# Patient Record
Sex: Female | Born: 1950 | Race: Black or African American | Hispanic: No | Marital: Single | Smoking: Former smoker
Health system: Southern US, Community
[De-identification: ages and names within clinical notes are randomized; demographics above are authoritative.]

## PROBLEM LIST (undated history)

## (undated) DIAGNOSIS — K219 Gastro-esophageal reflux disease without esophagitis: Secondary | ICD-10-CM

## (undated) DIAGNOSIS — R519 Headache, unspecified: Secondary | ICD-10-CM

## (undated) DIAGNOSIS — I10 Essential (primary) hypertension: Secondary | ICD-10-CM

## (undated) DIAGNOSIS — K760 Fatty (change of) liver, not elsewhere classified: Secondary | ICD-10-CM

## (undated) DIAGNOSIS — R51 Headache: Secondary | ICD-10-CM

## (undated) HISTORY — PX: TUBAL LIGATION: SHX77

## (undated) HISTORY — PX: TONSILLECTOMY: SUR1361

---

## 2015-02-22 DIAGNOSIS — I10 Essential (primary) hypertension: Secondary | ICD-10-CM | POA: Diagnosis not present

## 2015-02-22 DIAGNOSIS — R7309 Other abnormal glucose: Secondary | ICD-10-CM | POA: Diagnosis not present

## 2015-03-11 DIAGNOSIS — I10 Essential (primary) hypertension: Secondary | ICD-10-CM | POA: Diagnosis not present

## 2015-03-11 DIAGNOSIS — E785 Hyperlipidemia, unspecified: Secondary | ICD-10-CM | POA: Diagnosis not present

## 2015-03-11 DIAGNOSIS — B182 Chronic viral hepatitis C: Secondary | ICD-10-CM | POA: Diagnosis not present

## 2015-03-25 ENCOUNTER — Emergency Department (HOSPITAL_COMMUNITY)
Admission: EM | Admit: 2015-03-25 | Discharge: 2015-03-25 | Disposition: A | Discharge status: Home / Self Care | Payer: Medicare HMO | Attending: Emergency Medicine | Admitting: Emergency Medicine

## 2015-03-25 ENCOUNTER — Emergency Department (HOSPITAL_COMMUNITY): Payer: Medicare HMO

## 2015-03-25 ENCOUNTER — Encounter (HOSPITAL_COMMUNITY): Payer: Self-pay | Admitting: Emergency Medicine

## 2015-03-25 DIAGNOSIS — W1839XA Other fall on same level, initial encounter: Secondary | ICD-10-CM | POA: Diagnosis not present

## 2015-03-25 DIAGNOSIS — Y9289 Other specified places as the place of occurrence of the external cause: Secondary | ICD-10-CM | POA: Insufficient documentation

## 2015-03-25 DIAGNOSIS — S42202A Unspecified fracture of upper end of left humerus, initial encounter for closed fracture: Secondary | ICD-10-CM | POA: Diagnosis not present

## 2015-03-25 DIAGNOSIS — M25519 Pain in unspecified shoulder: Secondary | ICD-10-CM | POA: Diagnosis not present

## 2015-03-25 DIAGNOSIS — Y9389 Activity, other specified: Secondary | ICD-10-CM | POA: Diagnosis not present

## 2015-03-25 DIAGNOSIS — Y998 Other external cause status: Secondary | ICD-10-CM | POA: Insufficient documentation

## 2015-03-25 DIAGNOSIS — S4992XA Unspecified injury of left shoulder and upper arm, initial encounter: Secondary | ICD-10-CM | POA: Diagnosis present

## 2015-03-25 DIAGNOSIS — M25512 Pain in left shoulder: Secondary | ICD-10-CM | POA: Diagnosis not present

## 2015-03-25 DIAGNOSIS — S42412A Displaced simple supracondylar fracture without intercondylar fracture of left humerus, initial encounter for closed fracture: Secondary | ICD-10-CM

## 2015-03-25 DIAGNOSIS — T148 Other injury of unspecified body region: Secondary | ICD-10-CM | POA: Diagnosis not present

## 2015-03-25 HISTORY — DX: Fatty (change of) liver, not elsewhere classified: K76.0

## 2015-03-25 HISTORY — DX: Essential (primary) hypertension: I10

## 2015-03-25 MED ORDER — HYDROCODONE-ACETAMINOPHEN 5-325 MG PO TABS
2.0000 | ORAL_TABLET | Freq: Once | ORAL | Status: AC
  Administered 2015-03-25: 2 via ORAL
  Filled 2015-03-25: qty 2

## 2015-03-25 MED ORDER — HYDROCODONE-ACETAMINOPHEN 5-325 MG PO TABS: 1.0000 | ORAL_TABLET | Freq: Four times a day (QID) | ORAL | Status: DC | PRN

## 2015-03-25 NOTE — ED Provider Notes (Signed)
CSN: [id number]     Arrival date & time 03/25/15  [id number] History  By signing my name below, I, Marisue HumbleMichelle Chaffee, attest that this documentation has been prepared under the direction and in the presence of Geoffery Lyonsouglas Shalin Linders, MD . Electronically Signed: Marisue HumbleMichelle Chaffee, Scribe. 03/25/2015. 3:42 AM.   Chief Complaint  Patient presents with  . Fall  . Shoulder Injury   The history is provided by the patient. No language interpreter was used.   HPI Comments:  Joann Vang is a 65 y.o. female who presents to the Emergency Department via EMS s/p fall complaining of shoulder injury. Pt was walking yesterday ~ 1200 when she was charged by a neighborhood dog, causing her to fall backwards. She reports twisting her shoulder when she fell. EMS took her home at that time. Pt complains of left shoulder pain since fall. She woke up this morning to severe pain in left shoulder and mild swelling in left hand. Pain is exacerbated with movement.  No past medical history on file. No past surgical history on file. No family history on file. Social History  Substance Use Topics  . Smoking status: Not on file  . Smokeless tobacco: Not on file  . Alcohol Use: Not on file   OB History    No data available     Review of Systems  Musculoskeletal: Positive for joint swelling and arthralgias.  Neurological: Negative for syncope.  All other systems reviewed and are negative.   Allergies  Review of patient's allergies indicates not on file.  Home Medications   Prior to Admission medications   Not on File   BP 133/85 mmHg  Pulse 84  Temp(Src) 97.9 F (36.6 C) (Oral)  Resp 18  SpO2 100% Physical Exam  Constitutional: She appears well-developed and well-nourished. No distress.  HENT:  Head: Normocephalic and atraumatic.  Mouth/Throat: Oropharynx is clear and moist. No oropharyngeal exudate.  Eyes: Conjunctivae and EOM are normal. Pupils are equal, round, and reactive to light. Right eye exhibits no  discharge. Left eye exhibits no discharge. No scleral icterus.  Neck: Normal range of motion. Neck supple. No JVD present. No thyromegaly present.  Cardiovascular: Normal rate, regular rhythm, normal heart sounds and intact distal pulses.  Exam reveals no gallop and no friction rub.   No murmur heard. Pulmonary/Chest: Effort normal and breath sounds normal. No respiratory distress. She has no wheezes. She has no rales.  Abdominal: Soft. Bowel sounds are normal. She exhibits no distension and no mass. There is no tenderness.  Musculoskeletal: Normal range of motion. She exhibits no edema.  Left shoulder appears grossly normal. TTP over the lateral upper deltoid. Distal ulnar and radial pulses are easily palpable. She can flex, extend, and oppose all fingers. Sensation is intact throughout entire hand.  Lymphadenopathy:    She has no cervical adenopathy.  Neurological: She is alert. Coordination normal.  Skin: Skin is warm and dry. No rash noted. No erythema.  Psychiatric: She has a normal mood and affect. Her behavior is normal.  Nursing note and vitals reviewed.   ED Course  Procedures  DIAGNOSTIC STUDIES:  Oxygen Saturation is 100% on RA, normal by my interpretation.    COORDINATION OF CARE:  3:56 AM Will administer pain medication and order imaging.  Discussed treatment plan with pt at bedside and pt agreed to plan.  Labs Review Labs Reviewed - No data to display  Imaging Review No results found. I have personally reviewed and evaluated these images and lab  results as part of my medical decision-making.    MDM   Final diagnoses:  None    X-rays reveal a proximal humerus fracture which is nondisplaced. She will be placed in a sling and swath and follow-up information will provide provided with Dr. August Saucer.  I personally performed the services described in this documentation, which was scribed in my presence. The recorded information has been reviewed and is accurate.        Geoffery Lyons, MD 03/25/15 [phone]

## 2015-03-25 NOTE — ED Notes (Signed)
Bed: WA09 Expected date:  Expected time:  Means of arrival:  Comments: Shoulder pain s/p fall

## 2015-03-25 NOTE — Discharge Instructions (Signed)
Wear sling and swath as applied until followed up by Dr. August Saucerean. His contact information has been provided in this discharge summary for you to call and arrange this appointment.  Ice for 20 minutes every 2 hours while awake for the next 2-3 days.  Hydrocodone as prescribed as needed for pain.   Humerus Fracture Treated With Immobilization The humerus is the large bone in your upper arm. You have a broken (fractured) humerus. These fractures are easily diagnosed with X-rays. TREATMENT  Simple fractures which will heal without disability are treated with simple immobilization. Immobilization means you will wear a cast, splint, or sling. You have a fracture which will do well with immobilization. The fracture will heal well simply by being held in a good position until it is stable enough to begin range of motion exercises. Do not take part in activities which would further injure your arm.  HOME CARE INSTRUCTIONS   Put ice on the injured area.  Put ice in a plastic bag.  Place a towel between your skin and the bag.  Leave the ice on for 15-20 minutes, 03-04 times a day.  If you have a cast:  Do not scratch the skin under the cast using sharp or pointed objects.  Check the skin around the cast every day. You may put lotion on any red or sore areas.  Keep your cast dry and clean.  If you have a splint:  Wear the splint as directed.  Keep your splint dry and clean.  You may loosen the elastic around the splint if your fingers become numb, tingle, or turn cold or blue.  If you have a sling:  Wear the sling as directed.  Do not put pressure on any part of your cast or splint until it is fully hardened.  Your cast or splint can be protected during bathing with a plastic bag. Do not lower the cast or splint into water.  Only take over-the-counter or prescription medicines for pain, discomfort, or fever as directed by your caregiver.  Do range of motion exercises as instructed by  your caregiver.  Follow up as directed by your caregiver. This is very important in order to avoid permanent injury or disability and chronic pain. SEEK IMMEDIATE MEDICAL CARE IF:   Your skin or nails in the injured arm turn blue or gray.  Your arm feels cold or numb.  You develop severe pain in the injured arm.  You are having problems with the medicines you were given. MAKE SURE YOU:   Understand these instructions.  Will watch your condition.  Will get help right away if you are not doing well or get worse.   This information is not intended to replace advice given to you by your health care provider. Make sure you discuss any questions you have with your health care provider.   Document Released: 03/30/2000 Document Revised: 01/12/2014 Document Reviewed: 05/16/2014 Elsevier Interactive Patient Education [employer]2016 [employer] Inc[employer]

## 2015-03-25 NOTE — ED Notes (Signed)
Brought in by EMS from home with c/o shoulder pain after a fall.  Pt reports that she was walking yesterday at noon when a neighbor's dog "came charging at her, causing her to fall backwards, landing on her left side with left arm underneath".  Pt woke up this morning to severe pain and swelling to left shoulder.

## 2015-03-27 DIAGNOSIS — S42292A Other displaced fracture of upper end of left humerus, initial encounter for closed fracture: Secondary | ICD-10-CM | POA: Diagnosis not present

## 2015-04-04 DIAGNOSIS — S42292D Other displaced fracture of upper end of left humerus, subsequent encounter for fracture with routine healing: Secondary | ICD-10-CM | POA: Diagnosis not present

## 2015-04-05 ENCOUNTER — Other Ambulatory Visit: Payer: Self-pay | Admitting: Orthopedic Surgery

## 2015-04-08 ENCOUNTER — Other Ambulatory Visit (HOSPITAL_COMMUNITY): Payer: Self-pay | Admitting: *Deleted

## 2015-04-08 ENCOUNTER — Other Ambulatory Visit: Payer: Self-pay | Admitting: Orthopedic Surgery

## 2015-04-09 ENCOUNTER — Inpatient Hospital Stay (HOSPITAL_COMMUNITY)
Admission: RE | Admit: 2015-04-09 | Discharge: 2015-04-09 | Disposition: A | Discharge status: Home / Self Care | Payer: Medicare HMO | Source: Ambulatory Visit

## 2015-04-10 ENCOUNTER — Encounter (HOSPITAL_COMMUNITY): Payer: Self-pay | Admitting: *Deleted

## 2015-04-10 DIAGNOSIS — S42292D Other displaced fracture of upper end of left humerus, subsequent encounter for fracture with routine healing: Secondary | ICD-10-CM | POA: Diagnosis not present

## 2015-04-10 NOTE — Progress Notes (Signed)
Pt denies cardiac history, chest pain or sob. 

## 2015-04-11 ENCOUNTER — Inpatient Hospital Stay (HOSPITAL_COMMUNITY): Admission: RE | Admit: 2015-04-11 | Payer: Medicare HMO | Source: Ambulatory Visit | Admitting: Orthopedic Surgery

## 2015-04-11 HISTORY — DX: Headache, unspecified: R51.9

## 2015-04-11 HISTORY — DX: Headache: R51

## 2015-04-11 HISTORY — DX: Gastro-esophageal reflux disease without esophagitis: K21.9

## 2015-04-11 SURGERY — OPEN REDUCTION INTERNAL FIXATION (ORIF) PROXIMAL HUMERUS FRACTURE
Anesthesia: General | Site: Shoulder | Laterality: Left

## 2015-04-24 DIAGNOSIS — S42292D Other displaced fracture of upper end of left humerus, subsequent encounter for fracture with routine healing: Secondary | ICD-10-CM | POA: Diagnosis not present

## 2015-05-02 DIAGNOSIS — B182 Chronic viral hepatitis C: Secondary | ICD-10-CM | POA: Diagnosis not present

## 2015-05-02 DIAGNOSIS — E7800 Pure hypercholesterolemia, unspecified: Secondary | ICD-10-CM | POA: Diagnosis not present

## 2015-05-02 DIAGNOSIS — Z91128 Patient's intentional underdosing of medication regimen for other reason: Secondary | ICD-10-CM | POA: Diagnosis not present

## 2015-05-02 DIAGNOSIS — I10 Essential (primary) hypertension: Secondary | ICD-10-CM | POA: Diagnosis not present

## 2015-05-02 DIAGNOSIS — Z6828 Body mass index (BMI) 28.0-28.9, adult: Secondary | ICD-10-CM | POA: Diagnosis not present

## 2015-05-23 DIAGNOSIS — Z6828 Body mass index (BMI) 28.0-28.9, adult: Secondary | ICD-10-CM | POA: Diagnosis not present

## 2015-05-23 DIAGNOSIS — M12861 Other specific arthropathies, not elsewhere classified, right knee: Secondary | ICD-10-CM | POA: Diagnosis not present

## 2015-05-23 DIAGNOSIS — S42216D Unspecified nondisplaced fracture of surgical neck of unspecified humerus, subsequent encounter for fracture with routine healing: Secondary | ICD-10-CM | POA: Diagnosis not present

## 2015-05-23 DIAGNOSIS — E785 Hyperlipidemia, unspecified: Secondary | ICD-10-CM | POA: Diagnosis not present

## 2015-05-23 DIAGNOSIS — I119 Hypertensive heart disease without heart failure: Secondary | ICD-10-CM | POA: Diagnosis not present

## 2015-05-23 DIAGNOSIS — I10 Essential (primary) hypertension: Secondary | ICD-10-CM | POA: Diagnosis not present

## 2015-05-27 ENCOUNTER — Other Ambulatory Visit: Payer: Self-pay

## 2015-05-27 DIAGNOSIS — Z1231 Encounter for screening mammogram for malignant neoplasm of breast: Secondary | ICD-10-CM

## 2015-06-06 ENCOUNTER — Ambulatory Visit
Admission: RE | Admit: 2015-06-06 | Discharge: 2015-06-06 | Disposition: A | Discharge status: Home / Self Care | Payer: Medicare HMO | Source: Ambulatory Visit

## 2015-06-06 DIAGNOSIS — Z1231 Encounter for screening mammogram for malignant neoplasm of breast: Secondary | ICD-10-CM

## 2015-07-10 ENCOUNTER — Other Ambulatory Visit (HOSPITAL_COMMUNITY): Payer: Self-pay | Admitting: Nurse Practitioner

## 2015-07-10 DIAGNOSIS — B182 Chronic viral hepatitis C: Secondary | ICD-10-CM

## 2015-07-17 DIAGNOSIS — I119 Hypertensive heart disease without heart failure: Secondary | ICD-10-CM | POA: Diagnosis not present

## 2015-07-17 DIAGNOSIS — B182 Chronic viral hepatitis C: Secondary | ICD-10-CM | POA: Diagnosis not present

## 2015-07-17 DIAGNOSIS — E785 Hyperlipidemia, unspecified: Secondary | ICD-10-CM | POA: Diagnosis not present

## 2015-07-18 ENCOUNTER — Ambulatory Visit (HOSPITAL_COMMUNITY): Payer: Medicare HMO

## 2015-07-22 ENCOUNTER — Ambulatory Visit (HOSPITAL_COMMUNITY)
Admission: RE | Admit: 2015-07-22 | Discharge: 2015-07-22 | Disposition: A | Discharge status: Home / Self Care | Payer: Medicare HMO | Source: Ambulatory Visit | Attending: Nurse Practitioner | Admitting: Nurse Practitioner

## 2015-07-22 DIAGNOSIS — B182 Chronic viral hepatitis C: Secondary | ICD-10-CM

## 2015-07-24 ENCOUNTER — Ambulatory Visit (HOSPITAL_COMMUNITY): Payer: Medicare HMO

## 2015-07-26 ENCOUNTER — Ambulatory Visit (HOSPITAL_COMMUNITY): Payer: Medicare HMO

## 2015-08-07 DIAGNOSIS — I119 Hypertensive heart disease without heart failure: Secondary | ICD-10-CM | POA: Diagnosis not present

## 2015-08-07 DIAGNOSIS — I10 Essential (primary) hypertension: Secondary | ICD-10-CM | POA: Diagnosis not present

## 2015-08-07 DIAGNOSIS — B182 Chronic viral hepatitis C: Secondary | ICD-10-CM | POA: Diagnosis not present

## 2015-08-07 DIAGNOSIS — E7800 Pure hypercholesterolemia, unspecified: Secondary | ICD-10-CM | POA: Diagnosis not present

## 2015-08-28 ENCOUNTER — Other Ambulatory Visit (HOSPITAL_COMMUNITY): Payer: Self-pay | Admitting: Nurse Practitioner

## 2015-08-28 ENCOUNTER — Encounter (HOSPITAL_COMMUNITY): Payer: Self-pay

## 2015-08-28 ENCOUNTER — Ambulatory Visit (HOSPITAL_COMMUNITY): Admission: RE | Admit: 2015-08-28 | Payer: Medicare HMO | Source: Ambulatory Visit

## 2015-08-28 ENCOUNTER — Ambulatory Visit (HOSPITAL_COMMUNITY)
Admission: RE | Admit: 2015-08-28 | Discharge: 2015-08-28 | Disposition: A | Discharge status: Home / Self Care | Payer: Medicare HMO | Source: Ambulatory Visit | Attending: Nurse Practitioner | Admitting: Nurse Practitioner

## 2015-08-28 DIAGNOSIS — B182 Chronic viral hepatitis C: Secondary | ICD-10-CM

## 2015-08-28 DIAGNOSIS — K8020 Calculus of gallbladder without cholecystitis without obstruction: Secondary | ICD-10-CM | POA: Insufficient documentation

## 2015-09-17 DIAGNOSIS — B182 Chronic viral hepatitis C: Secondary | ICD-10-CM | POA: Diagnosis not present

## 2015-09-17 DIAGNOSIS — K740 Hepatic fibrosis: Secondary | ICD-10-CM | POA: Diagnosis not present

## 2015-10-08 DIAGNOSIS — I10 Essential (primary) hypertension: Secondary | ICD-10-CM | POA: Diagnosis not present

## 2015-10-08 DIAGNOSIS — B182 Chronic viral hepatitis C: Secondary | ICD-10-CM | POA: Diagnosis not present

## 2015-10-08 DIAGNOSIS — E785 Hyperlipidemia, unspecified: Secondary | ICD-10-CM | POA: Diagnosis not present

## 2015-10-23 DIAGNOSIS — B182 Chronic viral hepatitis C: Secondary | ICD-10-CM | POA: Diagnosis not present

## 2015-11-05 DIAGNOSIS — B182 Chronic viral hepatitis C: Secondary | ICD-10-CM | POA: Diagnosis not present

## 2015-11-05 DIAGNOSIS — K740 Hepatic fibrosis: Secondary | ICD-10-CM | POA: Diagnosis not present

## 2015-11-07 DIAGNOSIS — B182 Chronic viral hepatitis C: Secondary | ICD-10-CM | POA: Diagnosis not present

## 2015-11-07 DIAGNOSIS — E785 Hyperlipidemia, unspecified: Secondary | ICD-10-CM | POA: Diagnosis not present

## 2015-11-07 DIAGNOSIS — I10 Essential (primary) hypertension: Secondary | ICD-10-CM | POA: Diagnosis not present

## 2015-11-20 DIAGNOSIS — B182 Chronic viral hepatitis C: Secondary | ICD-10-CM | POA: Diagnosis not present

## 2016-01-09 DIAGNOSIS — I10 Essential (primary) hypertension: Secondary | ICD-10-CM | POA: Diagnosis not present

## 2016-02-13 DIAGNOSIS — B182 Chronic viral hepatitis C: Secondary | ICD-10-CM | POA: Diagnosis not present

## 2016-02-14 DIAGNOSIS — I10 Essential (primary) hypertension: Secondary | ICD-10-CM | POA: Diagnosis not present

## 2016-02-14 DIAGNOSIS — M12861 Other specific arthropathies, not elsewhere classified, right knee: Secondary | ICD-10-CM | POA: Diagnosis not present

## 2016-02-14 DIAGNOSIS — E785 Hyperlipidemia, unspecified: Secondary | ICD-10-CM | POA: Diagnosis not present

## 2016-02-19 DIAGNOSIS — B182 Chronic viral hepatitis C: Secondary | ICD-10-CM | POA: Diagnosis not present

## 2016-02-19 DIAGNOSIS — K740 Hepatic fibrosis: Secondary | ICD-10-CM | POA: Diagnosis not present

## 2016-05-08 ENCOUNTER — Other Ambulatory Visit: Payer: Self-pay | Admitting: Family Medicine

## 2016-05-08 DIAGNOSIS — Z1231 Encounter for screening mammogram for malignant neoplasm of breast: Secondary | ICD-10-CM

## 2016-05-15 DIAGNOSIS — M12861 Other specific arthropathies, not elsewhere classified, right knee: Secondary | ICD-10-CM | POA: Diagnosis not present

## 2016-05-15 DIAGNOSIS — I10 Essential (primary) hypertension: Secondary | ICD-10-CM | POA: Diagnosis not present

## 2016-05-15 DIAGNOSIS — M12841 Other specific arthropathies, not elsewhere classified, right hand: Secondary | ICD-10-CM | POA: Diagnosis not present

## 2016-05-15 DIAGNOSIS — B182 Chronic viral hepatitis C: Secondary | ICD-10-CM | POA: Diagnosis not present

## 2016-05-15 DIAGNOSIS — I119 Hypertensive heart disease without heart failure: Secondary | ICD-10-CM | POA: Diagnosis not present

## 2016-05-15 DIAGNOSIS — E785 Hyperlipidemia, unspecified: Secondary | ICD-10-CM | POA: Diagnosis not present

## 2016-06-02 DIAGNOSIS — Z1212 Encounter for screening for malignant neoplasm of rectum: Secondary | ICD-10-CM | POA: Diagnosis not present

## 2016-06-02 DIAGNOSIS — Z1211 Encounter for screening for malignant neoplasm of colon: Secondary | ICD-10-CM | POA: Diagnosis not present

## 2016-06-09 ENCOUNTER — Ambulatory Visit
Admission: RE | Admit: 2016-06-09 | Discharge: 2016-06-09 | Disposition: A | Discharge status: Home / Self Care | Payer: Medicare HMO | Source: Ambulatory Visit | Attending: Family Medicine | Admitting: Family Medicine

## 2016-06-09 DIAGNOSIS — Z1231 Encounter for screening mammogram for malignant neoplasm of breast: Secondary | ICD-10-CM | POA: Diagnosis not present

## 2016-08-03 ENCOUNTER — Other Ambulatory Visit (HOSPITAL_COMMUNITY): Payer: Self-pay | Admitting: Nurse Practitioner

## 2016-08-03 DIAGNOSIS — K7400 Hepatic fibrosis, unspecified: Secondary | ICD-10-CM

## 2016-08-11 ENCOUNTER — Ambulatory Visit (HOSPITAL_COMMUNITY)
Admission: RE | Admit: 2016-08-11 | Discharge: 2016-08-11 | Disposition: A | Discharge status: Home / Self Care | Payer: Medicare HMO | Source: Ambulatory Visit | Attending: Nurse Practitioner | Admitting: Nurse Practitioner

## 2016-08-11 DIAGNOSIS — K8020 Calculus of gallbladder without cholecystitis without obstruction: Secondary | ICD-10-CM | POA: Insufficient documentation

## 2016-08-11 DIAGNOSIS — K7400 Hepatic fibrosis, unspecified: Secondary | ICD-10-CM

## 2016-08-11 DIAGNOSIS — K7689 Other specified diseases of liver: Secondary | ICD-10-CM | POA: Diagnosis not present

## 2016-09-22 DIAGNOSIS — K740 Hepatic fibrosis: Secondary | ICD-10-CM | POA: Diagnosis not present

## 2016-09-22 DIAGNOSIS — E785 Hyperlipidemia, unspecified: Secondary | ICD-10-CM | POA: Diagnosis not present

## 2016-09-22 DIAGNOSIS — I10 Essential (primary) hypertension: Secondary | ICD-10-CM | POA: Diagnosis not present

## 2016-09-22 DIAGNOSIS — I119 Hypertensive heart disease without heart failure: Secondary | ICD-10-CM | POA: Diagnosis not present

## 2016-10-22 DIAGNOSIS — I10 Essential (primary) hypertension: Secondary | ICD-10-CM | POA: Diagnosis not present

## 2016-11-12 ENCOUNTER — Other Ambulatory Visit: Payer: Self-pay | Admitting: Family Medicine

## 2016-11-12 DIAGNOSIS — I119 Hypertensive heart disease without heart failure: Secondary | ICD-10-CM

## 2016-11-13 ENCOUNTER — Ambulatory Visit (INDEPENDENT_AMBULATORY_CARE_PROVIDER_SITE_OTHER): Payer: Medicare HMO

## 2016-11-13 DIAGNOSIS — I119 Hypertensive heart disease without heart failure: Secondary | ICD-10-CM | POA: Diagnosis not present

## 2016-11-24 DIAGNOSIS — I10 Essential (primary) hypertension: Secondary | ICD-10-CM | POA: Diagnosis not present

## 2017-02-01 DIAGNOSIS — K7689 Other specified diseases of liver: Secondary | ICD-10-CM | POA: Diagnosis not present

## 2017-02-01 DIAGNOSIS — K740 Hepatic fibrosis: Secondary | ICD-10-CM | POA: Diagnosis not present

## 2017-02-02 ENCOUNTER — Other Ambulatory Visit: Payer: Self-pay | Admitting: Nurse Practitioner

## 2017-02-02 DIAGNOSIS — IMO0001 Reserved for inherently not codable concepts without codable children: Secondary | ICD-10-CM

## 2017-02-02 DIAGNOSIS — K7689 Other specified diseases of liver: Secondary | ICD-10-CM

## 2017-02-12 ENCOUNTER — Ambulatory Visit
Admission: RE | Admit: 2017-02-12 | Discharge: 2017-02-12 | Disposition: A | Discharge status: Home / Self Care | Payer: Medicare HMO | Source: Ambulatory Visit | Attending: Nurse Practitioner | Admitting: Nurse Practitioner

## 2017-02-12 DIAGNOSIS — IMO0001 Reserved for inherently not codable concepts without codable children: Secondary | ICD-10-CM

## 2017-02-12 DIAGNOSIS — K7689 Other specified diseases of liver: Secondary | ICD-10-CM

## 2017-02-12 DIAGNOSIS — K8020 Calculus of gallbladder without cholecystitis without obstruction: Secondary | ICD-10-CM | POA: Diagnosis not present

## 2017-02-12 MED ORDER — GADOXETATE DISODIUM 0.25 MMOL/ML IV SOLN
6.0000 mL | Freq: Once | INTRAVENOUS | Status: AC | PRN
  Administered 2017-02-12: 6 mL via INTRAVENOUS

## 2017-03-09 DIAGNOSIS — K8020 Calculus of gallbladder without cholecystitis without obstruction: Secondary | ICD-10-CM | POA: Diagnosis not present

## 2017-03-09 DIAGNOSIS — K7689 Other specified diseases of liver: Secondary | ICD-10-CM | POA: Diagnosis not present

## 2017-03-25 DIAGNOSIS — I10 Essential (primary) hypertension: Secondary | ICD-10-CM | POA: Diagnosis not present

## 2017-03-25 DIAGNOSIS — E785 Hyperlipidemia, unspecified: Secondary | ICD-10-CM | POA: Diagnosis not present

## 2017-03-25 DIAGNOSIS — M12861 Other specific arthropathies, not elsewhere classified, right knee: Secondary | ICD-10-CM | POA: Diagnosis not present

## 2017-03-25 DIAGNOSIS — Z0000 Encounter for general adult medical examination without abnormal findings: Secondary | ICD-10-CM | POA: Diagnosis not present

## 2017-05-06 ENCOUNTER — Other Ambulatory Visit: Payer: Self-pay | Admitting: Family Medicine

## 2017-05-06 DIAGNOSIS — Z1231 Encounter for screening mammogram for malignant neoplasm of breast: Secondary | ICD-10-CM

## 2017-05-07 DIAGNOSIS — K7689 Other specified diseases of liver: Secondary | ICD-10-CM | POA: Diagnosis not present

## 2017-05-07 DIAGNOSIS — K8020 Calculus of gallbladder without cholecystitis without obstruction: Secondary | ICD-10-CM | POA: Diagnosis not present

## 2017-06-11 ENCOUNTER — Ambulatory Visit
Admission: RE | Admit: 2017-06-11 | Discharge: 2017-06-11 | Disposition: A | Discharge status: Home / Self Care | Payer: Medicare HMO | Source: Ambulatory Visit | Attending: Family Medicine | Admitting: Family Medicine

## 2017-06-11 DIAGNOSIS — Z1231 Encounter for screening mammogram for malignant neoplasm of breast: Secondary | ICD-10-CM

## 2017-07-22 DIAGNOSIS — I119 Hypertensive heart disease without heart failure: Secondary | ICD-10-CM | POA: Diagnosis not present

## 2017-07-22 DIAGNOSIS — E7800 Pure hypercholesterolemia, unspecified: Secondary | ICD-10-CM | POA: Diagnosis not present

## 2017-08-02 DIAGNOSIS — K740 Hepatic fibrosis: Secondary | ICD-10-CM | POA: Diagnosis not present

## 2017-08-02 DIAGNOSIS — K7469 Other cirrhosis of liver: Secondary | ICD-10-CM | POA: Diagnosis not present

## 2017-08-02 DIAGNOSIS — K7689 Other specified diseases of liver: Secondary | ICD-10-CM | POA: Diagnosis not present

## 2017-08-03 ENCOUNTER — Other Ambulatory Visit: Payer: Self-pay | Admitting: Nurse Practitioner

## 2017-08-03 DIAGNOSIS — K7689 Other specified diseases of liver: Secondary | ICD-10-CM

## 2017-08-03 DIAGNOSIS — IMO0001 Reserved for inherently not codable concepts without codable children: Secondary | ICD-10-CM

## 2017-08-03 DIAGNOSIS — K7469 Other cirrhosis of liver: Secondary | ICD-10-CM

## 2017-08-06 DIAGNOSIS — I119 Hypertensive heart disease without heart failure: Secondary | ICD-10-CM | POA: Diagnosis not present

## 2017-08-06 DIAGNOSIS — I10 Essential (primary) hypertension: Secondary | ICD-10-CM | POA: Diagnosis not present

## 2017-08-06 DIAGNOSIS — Z6825 Body mass index (BMI) 25.0-25.9, adult: Secondary | ICD-10-CM | POA: Diagnosis not present

## 2017-08-06 DIAGNOSIS — E785 Hyperlipidemia, unspecified: Secondary | ICD-10-CM | POA: Diagnosis not present

## 2017-08-06 DIAGNOSIS — Z91138 Patient's unintentional underdosing of medication regimen for other reason: Secondary | ICD-10-CM | POA: Diagnosis not present

## 2017-08-19 ENCOUNTER — Ambulatory Visit
Admission: RE | Admit: 2017-08-19 | Discharge: 2017-08-19 | Disposition: A | Discharge status: Home / Self Care | Payer: Medicare HMO | Source: Ambulatory Visit | Attending: Nurse Practitioner | Admitting: Nurse Practitioner

## 2017-08-19 DIAGNOSIS — K7689 Other specified diseases of liver: Secondary | ICD-10-CM

## 2017-08-19 DIAGNOSIS — K7469 Other cirrhosis of liver: Secondary | ICD-10-CM

## 2017-08-19 DIAGNOSIS — IMO0001 Reserved for inherently not codable concepts without codable children: Secondary | ICD-10-CM

## 2017-09-17 ENCOUNTER — Ambulatory Visit
Admission: RE | Admit: 2017-09-17 | Discharge: 2017-09-17 | Disposition: A | Discharge status: Home / Self Care | Payer: Medicare HMO | Source: Ambulatory Visit | Attending: Family Medicine | Admitting: Family Medicine

## 2017-09-17 ENCOUNTER — Other Ambulatory Visit: Payer: Self-pay | Admitting: Family Medicine

## 2017-09-17 DIAGNOSIS — R0602 Shortness of breath: Secondary | ICD-10-CM | POA: Diagnosis not present

## 2017-09-17 DIAGNOSIS — E785 Hyperlipidemia, unspecified: Secondary | ICD-10-CM | POA: Diagnosis not present

## 2017-09-17 DIAGNOSIS — R0689 Other abnormalities of breathing: Secondary | ICD-10-CM

## 2017-09-17 DIAGNOSIS — R0989 Other specified symptoms and signs involving the circulatory and respiratory systems: Principal | ICD-10-CM

## 2017-09-17 DIAGNOSIS — Z6826 Body mass index (BMI) 26.0-26.9, adult: Secondary | ICD-10-CM | POA: Diagnosis not present

## 2017-09-17 DIAGNOSIS — J984 Other disorders of lung: Secondary | ICD-10-CM | POA: Diagnosis not present

## 2017-09-17 DIAGNOSIS — I10 Essential (primary) hypertension: Secondary | ICD-10-CM | POA: Diagnosis not present

## 2018-01-13 DIAGNOSIS — K8020 Calculus of gallbladder without cholecystitis without obstruction: Secondary | ICD-10-CM | POA: Diagnosis not present

## 2018-01-13 DIAGNOSIS — R5383 Other fatigue: Secondary | ICD-10-CM | POA: Diagnosis not present

## 2018-01-13 DIAGNOSIS — I4510 Unspecified right bundle-branch block: Secondary | ICD-10-CM | POA: Diagnosis not present

## 2018-01-13 DIAGNOSIS — Z8619 Personal history of other infectious and parasitic diseases: Secondary | ICD-10-CM | POA: Diagnosis not present

## 2018-01-13 DIAGNOSIS — E559 Vitamin D deficiency, unspecified: Secondary | ICD-10-CM | POA: Diagnosis not present

## 2018-01-13 DIAGNOSIS — E785 Hyperlipidemia, unspecified: Secondary | ICD-10-CM | POA: Diagnosis not present

## 2018-01-13 DIAGNOSIS — I10 Essential (primary) hypertension: Secondary | ICD-10-CM | POA: Diagnosis not present

## 2018-01-13 DIAGNOSIS — R7301 Impaired fasting glucose: Secondary | ICD-10-CM | POA: Diagnosis not present

## 2018-02-07 DIAGNOSIS — K740 Hepatic fibrosis: Secondary | ICD-10-CM | POA: Diagnosis not present

## 2018-02-07 DIAGNOSIS — K7689 Other specified diseases of liver: Secondary | ICD-10-CM | POA: Diagnosis not present

## 2018-02-09 ENCOUNTER — Other Ambulatory Visit: Payer: Self-pay | Admitting: Nurse Practitioner

## 2018-02-09 DIAGNOSIS — K7689 Other specified diseases of liver: Secondary | ICD-10-CM

## 2018-02-23 ENCOUNTER — Ambulatory Visit
Admission: RE | Admit: 2018-02-23 | Discharge: 2018-02-23 | Disposition: A | Discharge status: Home / Self Care | Payer: Medicare HMO | Source: Ambulatory Visit | Attending: Nurse Practitioner | Admitting: Nurse Practitioner

## 2018-02-23 DIAGNOSIS — K8020 Calculus of gallbladder without cholecystitis without obstruction: Secondary | ICD-10-CM | POA: Diagnosis not present

## 2018-02-23 DIAGNOSIS — K7689 Other specified diseases of liver: Secondary | ICD-10-CM

## 2018-02-23 MED ORDER — GADOXETATE DISODIUM 0.25 MMOL/ML IV SOLN
6.0000 mL | Freq: Once | INTRAVENOUS | Status: AC | PRN
  Administered 2018-02-23: 6 mL via INTRAVENOUS

## 2018-03-10 ENCOUNTER — Other Ambulatory Visit: Payer: Self-pay

## 2018-03-10 ENCOUNTER — Encounter (INDEPENDENT_AMBULATORY_CARE_PROVIDER_SITE_OTHER): Payer: Self-pay | Admitting: Primary Care

## 2018-03-10 ENCOUNTER — Ambulatory Visit (INDEPENDENT_AMBULATORY_CARE_PROVIDER_SITE_OTHER): Payer: Medicare HMO | Admitting: Primary Care

## 2018-03-10 VITALS — BP 145/85 | HR 77 | Temp 97.7°F | Ht 63.0 in | Wt 165.6 lb

## 2018-03-10 DIAGNOSIS — I10 Essential (primary) hypertension: Secondary | ICD-10-CM

## 2018-03-10 DIAGNOSIS — K219 Gastro-esophageal reflux disease without esophagitis: Secondary | ICD-10-CM | POA: Diagnosis not present

## 2018-03-10 DIAGNOSIS — Z7689 Persons encountering health services in other specified circumstances: Secondary | ICD-10-CM

## 2018-03-10 MED ORDER — CHLORTHALIDONE 25 MG PO TABS: 25.0000 mg | ORAL_TABLET | Freq: Every day | ORAL | 1 refills | Status: DC

## 2018-03-10 MED ORDER — TELMISARTAN-AMLODIPINE 40-5 MG PO TABS: 0.5000 | ORAL_TABLET | Freq: Every day | ORAL | 1 refills | Status: DC

## 2018-03-10 NOTE — Progress Notes (Signed)
New Patient Office Visit  Subjective:  Patient ID: Joann Vang, female    DOB: Apr 02, 1950  Age: 68 y.o. MRN: [id number]  CC:  Chief Complaint  Patient presents with  . New Patient (Initial Visit)    HTN    HPI Joann Vang presents to establish care.  No PMH except HTN.  Past Medical History:  Diagnosis Date  . Fatty liver   . GERD (gastroesophageal reflux disease)    none recent  . Headache    migraines, once a year  . Hypertension     Past Surgical History:  Procedure Laterality Date  . TONSILLECTOMY    . TUBAL LIGATION      Family History  Problem Relation Age of Onset  . Congestive Heart Failure Mother   . Kidney failure Father   . Breast cancer Sister 99    Social History   Socioeconomic History  . Marital status: Single    Spouse name: Not on file  . Number of children: Not on file  . Years of education: Not on file  . Highest education level: Not on file  Occupational History  . Not on file  Social Needs  . Financial resource strain: Not on file  . Food insecurity:    Worry: Not on file    Inability: Not on file  . Transportation needs:    Medical: Not on file    Non-medical: Not on file  Tobacco Use  . Smoking status: Former Smoker    Last attempt to quit: 04/10/1995    Years since quitting: 22.9  . Smokeless tobacco: Never Used  Substance and Sexual Activity  . Alcohol use: Yes    Comment: none for a month (as of 04/10/15)  . Drug use: No  . Sexual activity: Never  Lifestyle  . Physical activity:    Days per week: Not on file    Minutes per session: Not on file  . Stress: Not on file  Relationships  . Social connections:    Talks on phone: Not on file    Gets together: Not on file    Attends religious service: Not on file    Active member of club or organization: Not on file    Attends meetings of clubs or organizations: Not on file    Relationship status: Not on file  . Intimate partner violence:    Fear of current or ex  partner: Not on file    Emotionally abused: Not on file    Physically abused: Not on file    Forced sexual activity: Not on file  Other Topics Concern  . Not on file  Social History Narrative  . Not on file    ROS Review of Systems  Constitutional: Negative.   HENT: Positive for dental problem.        Filling fell out of tooth appt with dentist  Eyes: Positive for visual disturbance.       Glasses  Respiratory: Negative.   Cardiovascular: Negative.   Gastrointestinal: Negative.   Endocrine: Negative.   Genitourinary: Negative.   Musculoskeletal: Negative.   Skin: Negative.   Allergic/Immunologic: Negative.   Neurological: Negative.   Hematological: Negative.   Psychiatric/Behavioral: Negative.     Objective:   Today's Vitals: BP (!) 145/85 (BP Location: Left Arm, Patient Position: Sitting, Cuff Size: Normal)   Pulse 77   Temp 97.7 F (36.5 C) (Oral)   Ht 5\' 3"  (1.6 m)   Wt 165 lb 9.6 oz (75.1  kg)   SpO2 94%   BMI 29.33 kg/m   Physical Exam Constitutional:      Appearance: Normal appearance.  HENT:     Head: Normocephalic.     Right Ear: Tympanic membrane normal.     Left Ear: Tympanic membrane normal.  Neck:     Musculoskeletal: Normal range of motion and neck supple.  Cardiovascular:     Rate and Rhythm: Normal rate and regular rhythm.     Pulses: Normal pulses.     Heart sounds: Normal heart sounds.  Abdominal:     General: Abdomen is flat. Bowel sounds are normal. There is distension.     Palpations: Abdomen is soft.  Musculoskeletal: Normal range of motion.  Skin:    General: Skin is warm and dry.  Neurological:     General: No focal deficit present.     Mental Status: She is alert and oriented to person, place, and time.  Psychiatric:        Mood and Affect: Mood normal.        Behavior: Behavior normal.     Assessment & Plan:   Problem List Items Addressed This Visit    None    Visit Diagnoses    Encounter to establish care with new  doctor    -  Primary   Essential hypertension       Gastroesophageal reflux disease, esophagitis presence not specified          Outpatient Encounter Medications as of 03/10/2018  Medication Sig  . aspirin 325 MG tablet Take 325-650 mg by mouth every 4 (four) hours as needed (for mild pain.).  Marland Kitchen chlorthalidone (HYGROTON) 25 MG tablet Take 25 mg by mouth daily.  . Telmisartan-Amlodipine 40-5 MG TABS Take 0.5 tablets by mouth daily.  . [DISCONTINUED] HYDROcodone-acetaminophen (NORCO) 5-325 MG tablet Take 1-2 tablets by mouth every 6 (six) hours as needed.   No facility-administered encounter medications on file as of 03/10/2018.    1. Encounter to establish care with new doctor   2. Essential hypertension Elevated at this visit just recently placed on a new Bp meds and readings at home 120/80 < . Refills no changes    3. Gastroesophageal reflux disease, esophagitis presence not specified Takes OTC Rolaids   Follow-up: Return in about 6 months (around 09/10/2018) for HTN.   Grayce Sessions, NP

## 2018-03-10 NOTE — Patient Instructions (Signed)

## 2018-05-03 ENCOUNTER — Other Ambulatory Visit: Payer: Self-pay | Admitting: Primary Care

## 2018-05-03 DIAGNOSIS — Z1231 Encounter for screening mammogram for malignant neoplasm of breast: Secondary | ICD-10-CM

## 2018-07-04 ENCOUNTER — Other Ambulatory Visit: Payer: Self-pay

## 2018-07-04 ENCOUNTER — Ambulatory Visit
Admission: RE | Admit: 2018-07-04 | Discharge: 2018-07-04 | Disposition: A | Discharge status: Home / Self Care | Payer: Medicare HMO | Source: Ambulatory Visit | Attending: Primary Care | Admitting: Primary Care

## 2018-07-04 DIAGNOSIS — Z1231 Encounter for screening mammogram for malignant neoplasm of breast: Secondary | ICD-10-CM | POA: Diagnosis not present

## 2018-07-12 DIAGNOSIS — K7689 Other specified diseases of liver: Secondary | ICD-10-CM | POA: Diagnosis not present

## 2018-09-13 ENCOUNTER — Encounter (INDEPENDENT_AMBULATORY_CARE_PROVIDER_SITE_OTHER): Payer: Self-pay | Admitting: Primary Care

## 2018-09-13 ENCOUNTER — Other Ambulatory Visit: Payer: Self-pay

## 2018-09-13 ENCOUNTER — Ambulatory Visit (INDEPENDENT_AMBULATORY_CARE_PROVIDER_SITE_OTHER): Payer: Medicare HMO | Admitting: Primary Care

## 2018-09-13 DIAGNOSIS — I10 Essential (primary) hypertension: Secondary | ICD-10-CM | POA: Diagnosis not present

## 2018-09-13 DIAGNOSIS — Z2821 Immunization not carried out because of patient refusal: Secondary | ICD-10-CM

## 2018-09-13 DIAGNOSIS — L298 Other pruritus: Secondary | ICD-10-CM | POA: Diagnosis not present

## 2018-09-13 DIAGNOSIS — Z760 Encounter for issue of repeat prescription: Secondary | ICD-10-CM

## 2018-09-13 DIAGNOSIS — L244 Irritant contact dermatitis due to drugs in contact with skin: Secondary | ICD-10-CM

## 2018-09-13 DIAGNOSIS — R21 Rash and other nonspecific skin eruption: Secondary | ICD-10-CM | POA: Diagnosis not present

## 2018-09-13 MED ORDER — CHLORTHALIDONE 25 MG PO TABS: 25.0000 mg | ORAL_TABLET | Freq: Every day | ORAL | 0 refills | Status: DC

## 2018-09-13 MED ORDER — TELMISARTAN-AMLODIPINE 40-5 MG PO TABS: 0.5000 | ORAL_TABLET | Freq: Every day | ORAL | 0 refills | Status: DC

## 2018-09-13 NOTE — Progress Notes (Signed)
Bp 148/88

## 2018-09-13 NOTE — Progress Notes (Addendum)
Established Patient Office Visit  Subjective:  Patient ID: Joann Vang, female    DOB: 07/12/1950  Age: 68 y.o. MRN: [id number]  CC:  Chief Complaint  Patient presents with  . Follow-up    HTN    HPI Joann PushMarie Bartle presents for follow up on hypertension -she denies Denies shortness of breath, headaches, chest pain or lower extremity edema. She also voiced a itchy rash that are on her hands - irritant from cleaning without using protective covering gloves.  Past Medical History:  Diagnosis Date  . Fatty liver   . GERD (gastroesophageal reflux disease)    none recent  . Headache    migraines, once a year  . Hypertension     Past Surgical History:  Procedure Laterality Date  . TONSILLECTOMY    . TUBAL LIGATION      Family History  Problem Relation Age of Onset  . Congestive Heart Failure Mother   . Kidney failure Father   . Breast cancer Sister [id number]    Social History   Socioeconomic History  . Marital status: Single    Spouse name: Not on file  . Number of children: Not on file  . Years of education: Not on file  . Highest education level: Not on file  Occupational History  . Not on file  Social Needs  . Financial resource strain: Not on file  . Food insecurity    Worry: Not on file    Inability: Not on file  . Transportation needs    Medical: Not on file    Non-medical: Not on file  Tobacco Use  . Smoking status: Former Smoker    Quit date: 04/10/1995    Years since quitting: 23.4  . Smokeless tobacco: Never Used  Substance and Sexual Activity  . Alcohol use: Yes    Comment: none for a month (as of 04/10/15)  . Drug use: No  . Sexual activity: Never  Lifestyle  . Physical activity    Days per week: Not on file    Minutes per session: Not on file  . Stress: Not on file  Relationships  . Social Musicianconnections    Talks on phone: Not on file    Gets together: Not on file    Attends religious service: Not on file    Active member of club or  organization: Not on file    Attends meetings of clubs or organizations: Not on file    Relationship status: Not on file  . Intimate partner violence    Fear of current or ex partner: Not on file    Emotionally abused: Not on file    Physically abused: Not on file    Forced sexual activity: Not on file  Other Topics Concern  . Not on file  Social History Narrative  . Not on file    Outpatient Medications Prior to Visit  Medication Sig Dispense Refill  . aspirin 325 MG tablet Take 325-650 mg by mouth every 4 (four) hours as needed (for mild pain.).    Marland Kitchen. chlorthalidone (HYGROTON) 25 MG tablet Take 1 tablet (25 mg total) by mouth daily. 90 tablet 1  . Telmisartan-amLODIPine 40-5 MG TABS Take 0.5 tablets by mouth daily. 90 tablet 1   No facility-administered medications prior to visit.     No Known Allergies  ROS Review of Systems  Skin: Negative for rash.  All other systems reviewed and are negative.     Objective:    Physical Exam  Constitutional: She is oriented to person, place, and time. She appears well-developed and well-nourished.  HENT:  Head: Normocephalic.  Eyes: EOM are normal.  Neck: Normal range of motion. Neck supple.  Cardiovascular: Normal rate and regular rhythm.  Pulmonary/Chest: Effort normal and breath sounds normal.  Abdominal: Soft. Bowel sounds are normal. She exhibits distension.  Musculoskeletal: Normal range of motion.  Neurological: She is oriented to person, place, and time.  Skin: Skin is warm. Rash noted. There is erythema.  Psychiatric: She has a normal mood and affect.    There were no vitals taken for this visit. Wt Readings from Last 3 Encounters:  03/10/18 165 lb 9.6 oz (75.1 kg)     Health Maintenance Due  Topic Date Due  . COLONOSCOPY  11/02/1999  . DEXA SCAN  11/02/2014  . INFLUENZA VACCINE  08/06/2018    There are no preventive care reminders to display for this patient.    Assessment & Plan:  [name] was seen today  for follow-up.  Diagnoses and all orders for this visit:  Influenza vaccination declined  Irritant contact dermatitis due to drug in contact with skin Contact dermatitis is hypersensitivity reaction to a substance causing cellular immunity response. This may cause papules, vesicles, bullae with surrounding erythema and pruritic .Conservative treatment luke warm baths, Aveeno oatmeal bath or emollients. Medications can be discussed at this visit- antihistamine or pruritic medications. Causes may be contributed to changes in soaps, deodorants, laundry detergent or environmental- grass, weeds and trees.  Essential hypertension Counseled on blood pressure goal of less than 130/80, low-sodium, DASH diet, medication compliance, 150 minutes of moderate intensity exercise per week. Discussed medication compliance, adverse effects. Other orders/Medication Refilled -     Telmisartan-amLODIPine 40-5 MG TABS; Take 0.5 tablets by mouth daily. -     chlorthalidone (HYGROTON) 25 MG tablet; Take 1 tablet (25 mg total) by mouth daily.     Meds ordered this encounter  Medications  . Telmisartan-amLODIPine 40-5 MG TABS    Sig: Take 0.5 tablets by mouth daily.    Dispense:  90 tablet    Refill:  0  . chlorthalidone (HYGROTON) 25 MG tablet    Sig: Take 1 tablet (25 mg total) by mouth daily.    Dispense:  90 tablet    Refill:  0    Follow-up: No follow-ups on file.    Kerin Perna, NP

## 2018-12-10 ENCOUNTER — Other Ambulatory Visit (INDEPENDENT_AMBULATORY_CARE_PROVIDER_SITE_OTHER): Payer: Self-pay | Admitting: Primary Care

## 2019-02-13 DIAGNOSIS — K7689 Other specified diseases of liver: Secondary | ICD-10-CM | POA: Diagnosis not present

## 2019-02-13 DIAGNOSIS — K7402 Hepatic fibrosis, advanced fibrosis: Secondary | ICD-10-CM | POA: Diagnosis not present

## 2019-02-15 ENCOUNTER — Other Ambulatory Visit: Payer: Self-pay | Admitting: Nurse Practitioner

## 2019-02-15 DIAGNOSIS — K7689 Other specified diseases of liver: Secondary | ICD-10-CM

## 2019-02-27 DIAGNOSIS — K7402 Hepatic fibrosis, advanced fibrosis: Secondary | ICD-10-CM | POA: Diagnosis not present

## 2019-03-06 ENCOUNTER — Other Ambulatory Visit: Payer: Medicare HMO

## 2019-03-07 DIAGNOSIS — H0012 Chalazion right lower eyelid: Secondary | ICD-10-CM | POA: Diagnosis not present

## 2019-03-10 ENCOUNTER — Other Ambulatory Visit: Payer: Self-pay

## 2019-03-10 ENCOUNTER — Ambulatory Visit
Admission: RE | Admit: 2019-03-10 | Discharge: 2019-03-10 | Disposition: A | Discharge status: Home / Self Care | Payer: Medicare HMO | Source: Ambulatory Visit | Attending: Nurse Practitioner | Admitting: Nurse Practitioner

## 2019-03-10 DIAGNOSIS — K7689 Other specified diseases of liver: Secondary | ICD-10-CM | POA: Diagnosis not present

## 2019-03-10 MED ORDER — GADOBENATE DIMEGLUMINE 529 MG/ML IV SOLN
15.0000 mL | Freq: Once | INTRAVENOUS | Status: AC | PRN
  Administered 2019-03-10: 15 mL via INTRAVENOUS

## 2019-03-20 ENCOUNTER — Other Ambulatory Visit (INDEPENDENT_AMBULATORY_CARE_PROVIDER_SITE_OTHER): Payer: Self-pay | Admitting: Primary Care

## 2019-03-20 DIAGNOSIS — I10 Essential (primary) hypertension: Secondary | ICD-10-CM

## 2019-03-20 MED ORDER — CHLORTHALIDONE 25 MG PO TABS: 25.0000 mg | ORAL_TABLET | Freq: Every day | ORAL | 0 refills | Status: DC

## 2019-03-20 MED ORDER — TELMISARTAN-AMLODIPINE 40-5 MG PO TABS: 0.5000 | ORAL_TABLET | Freq: Every day | ORAL | 0 refills | Status: DC

## 2019-03-29 DIAGNOSIS — H0012 Chalazion right lower eyelid: Secondary | ICD-10-CM | POA: Diagnosis not present

## 2019-04-13 ENCOUNTER — Other Ambulatory Visit: Payer: Self-pay

## 2019-04-13 ENCOUNTER — Ambulatory Visit (INDEPENDENT_AMBULATORY_CARE_PROVIDER_SITE_OTHER): Payer: Medicare HMO | Admitting: Primary Care

## 2019-04-13 ENCOUNTER — Other Ambulatory Visit (INDEPENDENT_AMBULATORY_CARE_PROVIDER_SITE_OTHER): Payer: Self-pay | Admitting: Primary Care

## 2019-04-13 ENCOUNTER — Encounter (INDEPENDENT_AMBULATORY_CARE_PROVIDER_SITE_OTHER): Payer: Self-pay | Admitting: Primary Care

## 2019-04-13 VITALS — BP 148/95 | HR 74 | Temp 96.3°F | Ht 63.0 in | Wt 175.0 lb

## 2019-04-13 DIAGNOSIS — I10 Essential (primary) hypertension: Secondary | ICD-10-CM | POA: Diagnosis not present

## 2019-04-13 DIAGNOSIS — K7469 Other cirrhosis of liver: Secondary | ICD-10-CM

## 2019-04-13 DIAGNOSIS — Z760 Encounter for issue of repeat prescription: Secondary | ICD-10-CM

## 2019-04-13 MED ORDER — CHLORTHALIDONE 25 MG PO TABS: 25.0000 mg | ORAL_TABLET | Freq: Every day | ORAL | 1 refills | Status: DC

## 2019-04-13 MED ORDER — TELMISARTAN-AMLODIPINE 40-5 MG PO TABS: 0.5000 | ORAL_TABLET | Freq: Every day | ORAL | 1 refills | Status: DC

## 2019-04-13 NOTE — Progress Notes (Signed)
lip

## 2019-04-13 NOTE — Patient Instructions (Signed)

## 2019-04-13 NOTE — Progress Notes (Signed)
[name]  Is a 69 year old African American female with a history of hypertension. She is able to monitor her blood pressure at home. Only reason why she is in today is for a refill on blood pressure medication .    Patient reports adherence with medications.  Current Medication List Current Outpatient Medications on File Prior to Visit  Medication Sig Dispense Refill  . chlorthalidone (HYGROTON) 25 MG tablet Take 1 tablet (25 mg total) by mouth daily. 30 tablet 0  . Telmisartan-amLODIPine 40-5 MG TABS Take 0.5 tablets by mouth daily. 30 tablet 0   No current facility-administered medications on file prior to visit.   Past Medical History  Past Medical History:  Diagnosis Date  . Fatty liver   . GERD (gastroesophageal reflux disease)    none recent  . Headache    migraines, once a year  . Hypertension    Dietary habits include: low sodium diet/hearthy heart Exercise habits include:3 times a week  Family / Social history:   ASCVD risk factors include- [place] CHA2DS2-VASc Score =  3 The patient's score is based upon:        ASSESSMENT AND PLAN: Dynver was seen today for hypertension.  Diagnoses and all orders for this visit:  Essential hypertension Counseled on blood pressure goal of less than 130/80, low-sodium, DASH diet, medication compliance, 150 minutes of moderate intensity exercise per week. Readings at home systolic ranges 761-607 and diastolic 37-10 Discussed medication compliance, adverse effects.  Medication refill Chlorthalidone 25 mg daily  Telmisartan-amlodipine 40/5mg  daily    Signed,  Kerin Perna, NP    04/13/2019 9:41 AM    O:  Physical Exam Vitals reviewed.  Constitutional:      Appearance: She is obese.  HENT:     Head: Normocephalic.     Nose: Nose normal.  Eyes:     Extraocular Movements: Extraocular movements intact.     Pupils: Pupils are equal, round, and reactive to light.  Cardiovascular:     Rate and Rhythm: Normal  rate and regular rhythm.  Pulmonary:     Effort: Pulmonary effort is normal.     Breath sounds: Normal breath sounds.  Abdominal:     General: Bowel sounds are normal.  Musculoskeletal:        General: Normal range of motion.     Cervical back: Normal range of motion and neck supple.  Skin:    General: Skin is warm and dry.  Neurological:     Mental Status: She is alert and oriented to person, place, and time.  Psychiatric:        Mood and Affect: Mood normal.        Behavior: Behavior normal.        Thought Content: Thought content normal.      Review of Systems  All other systems reviewed and are negative.   Last 3 Office BP readings: BP Readings from Last 3 Encounters:  04/13/19 (!) 148/95  03/10/18 (!) 145/85  03/25/15 130/78    BMET No results found for: NA, K, CL, CO2, GLUCOSE, BUN, CREATININE, CALCIUM, GFRNONAA, GFRAA  Renal function: CrCl cannot be calculated (No successful lab value found.).  Clinical ASCVD: Yes  The ASCVD Risk score Mikey Bussing DC Jr., et al., 2013) failed to calculate for the following reasons:   Cannot find a previous HDL lab   Cannot find a previous total cholesterol lab   A/P: Essential hypertension Hypertension longstanding diagnosed currently  on current  medications. BP Goal = 130/80 mmHg. Patient is adherent with current medications.  -Continued on current regiment  -F/u labs ordered - future CMP, lipids, CBC  -Counseled on lifestyle modifications for blood pressure control including reduced dietary sodium, increased exercise, adequate sleep [name] was seen today for hypertension.   Medication refill -     chlorthalidone (HYGROTON) 25 MG tablet; Take 1 tablet (25 mg total) by mouth daily. -     Telmisartan-amLODIPine 40-5 MG TABS; Take 0.5 tablets by mouth daily.

## 2019-05-29 ENCOUNTER — Other Ambulatory Visit: Payer: Self-pay | Admitting: Primary Care

## 2019-05-29 DIAGNOSIS — Z1231 Encounter for screening mammogram for malignant neoplasm of breast: Secondary | ICD-10-CM

## 2019-07-05 ENCOUNTER — Ambulatory Visit
Admission: RE | Admit: 2019-07-05 | Discharge: 2019-07-05 | Disposition: A | Discharge status: Home / Self Care | Payer: Medicare HMO | Source: Ambulatory Visit | Attending: Primary Care | Admitting: Primary Care

## 2019-07-05 ENCOUNTER — Other Ambulatory Visit: Payer: Self-pay

## 2019-07-05 DIAGNOSIS — Z1231 Encounter for screening mammogram for malignant neoplasm of breast: Secondary | ICD-10-CM | POA: Diagnosis not present

## 2019-07-17 DIAGNOSIS — H524 Presbyopia: Secondary | ICD-10-CM | POA: Diagnosis not present

## 2019-07-18 ENCOUNTER — Other Ambulatory Visit: Payer: Self-pay

## 2019-07-18 ENCOUNTER — Encounter (INDEPENDENT_AMBULATORY_CARE_PROVIDER_SITE_OTHER): Payer: Self-pay | Admitting: Primary Care

## 2019-07-18 ENCOUNTER — Ambulatory Visit (INDEPENDENT_AMBULATORY_CARE_PROVIDER_SITE_OTHER): Payer: Medicare HMO | Admitting: Primary Care

## 2019-07-18 VITALS — BP 159/87 | HR 61 | Temp 98.0°F | Ht 63.0 in | Wt 174.8 lb

## 2019-07-18 DIAGNOSIS — I10 Essential (primary) hypertension: Secondary | ICD-10-CM

## 2019-07-18 DIAGNOSIS — Z1211 Encounter for screening for malignant neoplasm of colon: Secondary | ICD-10-CM

## 2019-07-18 NOTE — Patient Instructions (Signed)
We have discussed target BP range and blood pressure goal. I have advised patient to check BP regularly and to call us back or report to clinic if the numbers are consistently higher than 140/90. We discussed the importance of compliance with medical therapy and DASH diet recommended, consequences of uncontrolled hypertension discussed.  - continue current BP medications Check blood pressure daily at the same time. Keep a log of all blood pressure readings.  DASH DIET; No salt or low sodium diet  If pressure if greater than 150/100 notify me here at the office.  If it's persistently greater 180/90, go to the Emergency Department.  Take all medication as prescribed.   Avoid smoked meats which are high in sodium content.  Avoid soda which contains sodium and are high in sugar which increases your risk for diabetes.

## 2019-07-18 NOTE — Progress Notes (Signed)
Established Patient Office Visit  Subjective:  Patient ID: Joann Vang, female    DOB: 1950-06-16  Age: 69 y.o. MRN: [id number]  CC:  Chief Complaint  Patient presents with  . fecal occult    HPI [name] is a 69 year old female presents today for a colon guard test her insurance sent her information she needed it done. Blood pressure is elevated. She denies shortness of breath, headaches, chest pain or lower extremity edema. Pharmacy has change medication will called now taking 3 pills instead of 2. Called [facility] closed left a message.    Past Medical History:  Diagnosis Date  . Fatty liver   . GERD (gastroesophageal reflux disease)    none recent  . Headache    migraines, once a year  . Hypertension     Past Surgical History:  Procedure Laterality Date  . TONSILLECTOMY    . TUBAL LIGATION      Family History  Problem Relation Age of Onset  . Congestive Heart Failure Mother   . Kidney failure Father   . Breast cancer Sister 39    Social History   Socioeconomic History  . Marital status: Single    Spouse name: Not on file  . Number of children: Not on file  . Years of education: Not on file  . Highest education level: Not on file  Occupational History  . Not on file  Tobacco Use  . Smoking status: Former Smoker    Quit date: 04/10/1995    Years since quitting: 24.2  . Smokeless tobacco: Never Used  Substance and Sexual Activity  . Alcohol use: Yes    Comment: none for a month (as of 04/10/15)  . Drug use: No  . Sexual activity: Never  Other Topics Concern  . Not on file  Social History Narrative  . Not on file   Social Determinants of Health   Financial Resource Strain:   . Difficulty of Paying Living Expenses:   Food Insecurity:   . Worried About Charity fundraiser in the Last Year:   . Arboriculturist in the Last Year:   Transportation Needs:   . Film/video editor (Medical):   Marland Kitchen Lack of Transportation (Non-Medical):    Physical Activity:   . Days of Exercise per Week:   . Minutes of Exercise per Session:   Stress:   . Feeling of Stress :   Social Connections:   . Frequency of Communication with Friends and Family:   . Frequency of Social Gatherings with Friends and Family:   . Attends Religious Services:   . Active Member of Clubs or Organizations:   . Attends Archivist Meetings:   Marland Kitchen Marital Status:   Intimate Partner Violence:   . Fear of Current or Ex-Partner:   . Emotionally Abused:   Marland Kitchen Physically Abused:   . Sexually Abused:     Outpatient Medications Prior to Visit  Medication Sig Dispense Refill  . chlorthalidone (HYGROTON) 25 MG tablet Take 1 tablet (25 mg total) by mouth daily. 90 tablet 1  . Telmisartan-amLODIPine 40-5 MG TABS Take 0.5 tablets by mouth daily. 90 tablet 1  . amLODipine (NORVASC) 2.5 MG tablet Take 2.5 mg by mouth daily.    Marland Kitchen telmisartan (MICARDIS) 20 MG tablet Take 20 mg by mouth daily.     No facility-administered medications prior to visit.    No Known Allergies  ROS Review of Systems  All other  systems reviewed and are negative.     Objective:    Physical Exam Vitals:   07/18/19 1619  BP: (!) 159/87  Pulse: 61  Temp: 98 F (36.7 C)  TempSrc: Oral  SpO2: 93%  Weight: 174 lb 12.8 oz (79.3 kg)  Height: '5\' 3"'  (1.6 m)   General: Vital signs reviewed.  Patient is well-developed and well-nourished, in no acute distress and cooperative with exam.  Head: Normocephalic and atraumatic. Eyes: EOMI, conjunctivae normal, no scleral icterus. (no red reflex right eye) Neck: Supple, trachea midline, normal ROM, no JVD, masses, thyromegaly, or carotid bruit present.  Cardiovascular: RRR, S1 normal, S2 normal, no murmurs, gallops, or rubs. Pulmonary/Chest: Clear to auscultation bilaterally, no wheezes, rales, or rhonchi. Abdominal: Soft, non-tender, non-distended, BS +, no masses, organomegaly, or guarding present.  Musculoskeletal: No joint  deformities, erythema, or stiffness, ROM full and nontender. Extremities: No lower extremity edema bilaterally,  pulses symmetric and intact bilaterally. No cyanosis or clubbing. Neurological: A&O x3, Strength is normal and symmetric bilaterally, cranial nerve II-XII are grossly intact, no focal motor deficit, sensory intact to light touch bilaterally.  Skin: Warm, dry and intact. No rashes or erythema. Psychiatric: Normal mood and affect. speech and behavior is normal. Cognition and memory are normal.    Health Maintenance Due  Topic Date Due  . COVID-19 Vaccine (1) Never done  . COLONOSCOPY  Never done    There are no preventive care reminders to display for this patient.  No results found for: TSH No results found for: WBC, HGB, HCT, MCV, PLT No results found for: NA, K, CHLORIDE, CO2, GLUCOSE, BUN, CREATININE, BILITOT, ALKPHOS, AST, ALT, PROT, ALBUMIN, CALCIUM, ANIONGAP, EGFR, GFR No results found for: CHOL No results found for: HDL No results found for: LDLCALC No results found for: TRIG No results found for: CHOLHDL No results found for: HGBA1C    Assessment & Plan:  Rosario was seen today for fecal occult.  Diagnoses and all orders for this visit:  Special screening for malignant neoplasms, colon -     Fecal occult blood, imunochemical([facility]); Future  Essential hypertension Counseled on blood pressure goal of less than 130/80, elevated today. She is able to check bp at home continue low-sodium, DASH diet, medication compliance, 150 minutes of moderate intensity exercise per week. She prefers to take medication at bed time   No orders of the defined types were placed in this encounter.   Follow-up: Return in about 3 weeks (around 08/08/2019) for tele BP ck.    Kerin Perna, NP

## 2019-07-19 ENCOUNTER — Other Ambulatory Visit (INDEPENDENT_AMBULATORY_CARE_PROVIDER_SITE_OTHER): Payer: Self-pay | Admitting: Primary Care

## 2019-07-19 ENCOUNTER — Telehealth (INDEPENDENT_AMBULATORY_CARE_PROVIDER_SITE_OTHER): Payer: Self-pay | Admitting: Primary Care

## 2019-07-19 ENCOUNTER — Other Ambulatory Visit (INDEPENDENT_AMBULATORY_CARE_PROVIDER_SITE_OTHER): Payer: Self-pay

## 2019-07-19 DIAGNOSIS — Z1211 Encounter for screening for malignant neoplasm of colon: Secondary | ICD-10-CM | POA: Diagnosis not present

## 2019-07-19 DIAGNOSIS — I10 Essential (primary) hypertension: Secondary | ICD-10-CM

## 2019-07-19 MED ORDER — TELMISARTAN-AMLODIPINE 40-5 MG PO TABS: 0.5000 | ORAL_TABLET | Freq: Every day | ORAL | 1 refills | Status: DC

## 2019-07-19 NOTE — Telephone Encounter (Signed)
Sent to PCP ?

## 2019-07-19 NOTE — Progress Notes (Signed)
Received called from [facility] to clarify mediation she was sent Telmisartan 20mg  and amlodipine 2.5 90 day supply . Reading yesterday concerned me. Intended to increase dose but today she confessed she had eaten something and today her reading is 120/78. No medication changes . Will do a Bp tele in 2-3 weeks.

## 2019-07-19 NOTE — Telephone Encounter (Signed)
Spoke with pharmacist at 9:00 am and this was resolved. Called [facility] spoke with pharmacist he assured me no one from there placed a called regarding her medication.

## 2019-07-19 NOTE — Telephone Encounter (Signed)
Called to get clarification on a medication dosage for Telmisartan-amLODIPine 40-5 MG TABS. Please call to discuss at [phone]

## 2019-07-20 LAB — FECAL OCCULT BLOOD, IMMUNOCHEMICAL: Fecal Occult Bld: NEGATIVE

## 2019-07-21 ENCOUNTER — Telehealth (INDEPENDENT_AMBULATORY_CARE_PROVIDER_SITE_OTHER): Payer: Self-pay

## 2019-07-21 NOTE — Telephone Encounter (Signed)
Patient is aware of negative colon cancer screening, repeat in 1 year. She verbalized understanding. Maryjean Morn, CMA

## 2019-07-21 NOTE — Telephone Encounter (Signed)
-----   Message from Grayce Sessions, NP sent at 07/21/2019  1:22 PM EDT ----- FOB is negative will repeat in 1 year

## 2019-08-09 ENCOUNTER — Telehealth (INDEPENDENT_AMBULATORY_CARE_PROVIDER_SITE_OTHER): Payer: Medicare HMO | Admitting: Primary Care

## 2019-08-09 ENCOUNTER — Other Ambulatory Visit: Payer: Self-pay

## 2019-08-09 ENCOUNTER — Encounter (INDEPENDENT_AMBULATORY_CARE_PROVIDER_SITE_OTHER): Payer: Self-pay | Admitting: Primary Care

## 2019-08-09 DIAGNOSIS — I10 Essential (primary) hypertension: Secondary | ICD-10-CM

## 2019-08-09 DIAGNOSIS — Z760 Encounter for issue of repeat prescription: Secondary | ICD-10-CM | POA: Diagnosis not present

## 2019-08-09 MED ORDER — CHLORTHALIDONE 25 MG PO TABS: 25.0000 mg | ORAL_TABLET | Freq: Every day | ORAL | 1 refills | Status: DC

## 2019-08-09 MED ORDER — TELMISARTAN-AMLODIPINE 40-5 MG PO TABS: 0.5000 | ORAL_TABLET | Freq: Every day | ORAL | 1 refills | Status: DC

## 2019-08-09 NOTE — Progress Notes (Addendum)
                    [facility]  I connected with Ms. Rayleen Wyrick  by telephone  and verified that I am speaking with the correct person using two identifiers. Patient is at home  I discussed the limitations, risks, security and privacy concerns of performing an evaluation and management service by telephone and the availability of in person appointments. I also discussed with the patient that there may be a patient responsible charge related to this service. The patient expressed understanding and agreed to proceed.  Gwinda Passe Np-C at office  Ms. Mauri Temkin is a 69 year old female having a blood pressure follow-up.  She is currently on chlorthalidone 25 mg and telmisartan/amlodipine 40/5  She is  taking daily Patient reports adherence with medications.  Current Medication List telmisartan/amlodipine 40/5 daily chlorthalidone 25 mg daily  Past Medical History  Past Medical History:  Diagnosis Date  . Fatty liver   . GERD (gastroesophageal reflux disease)    none recent  . Headache    migraines, once a year  . Hypertension    Dietary habits include: healthy diet  Exercise habits include:very little  Family / Social history: unknown  ASCVD risk factors include-   Review of Systems  All other systems reviewed and are negative.   Last 3 Office BP readings: BP Readings from Last 3 Encounters:  07/18/19 (!) 159/87  04/13/19 (!) 148/95  03/10/18 (!) 145/85    BMET No results found for: NA, K, CL, CO2, GLUCOSE, BUN, CREATININE, CALCIUM, GFRNONAA, GFRAA  Renal function: CrCl cannot be calculated (No successful lab value found.).  Clinical ASCVD: Yes  The ASCVD Risk score Denman George DC Jr., et al., 2013) failed to calculate for the following reasons:   Cannot find a previous HDL lab   Cannot find a previous total cholesterol lab   Glendy was seen today for blood pressure check.  Diagnoses and all orders for this visit:  Medication  refill Telmisartan-amLODIPine 40-5 MG TABS; Take 0.5 tablets by mouth daily. -     chlorthalidone (HYGROTON) 25 MG tablet; Take 1 tablet (25 mg total) by mouth daily.   Essential hypertension Hypertension longstanding currently Telmaisatan-amlodipine 40/5  on current medications. BP Goal = 130/80  mmHg. Patient is adherent with current medications.  -F/u labs ordered -  -Counseled on lifestyle modifications for blood pressure control including reduced dietary sodium, increased exercise, adequate sleep Time spent 12 mins Grayce Sessions

## 2019-10-06 ENCOUNTER — Other Ambulatory Visit: Payer: Self-pay

## 2019-10-06 ENCOUNTER — Other Ambulatory Visit (INDEPENDENT_AMBULATORY_CARE_PROVIDER_SITE_OTHER): Payer: Medicare HMO

## 2019-10-06 DIAGNOSIS — I10 Essential (primary) hypertension: Secondary | ICD-10-CM

## 2019-10-07 LAB — LIPID PANEL
Chol/HDL Ratio: 4.2 ratio (ref 0.0–4.4)
Cholesterol, Total: 190 mg/dL (ref 100–199)
HDL: 45 mg/dL (ref >39)
LDL Chol Calc (NIH): 127 mg/dL — ABNORMAL HIGH (ref 0–99)
Triglycerides: 99 mg/dL (ref 0–149)
VLDL Cholesterol Cal: 18 mg/dL (ref 5–40)

## 2019-10-07 LAB — CMP14+EGFR
ALT: 15 IU/L (ref 0–32)
AST: 19 IU/L (ref 0–40)
Albumin/Globulin Ratio: 1.3 (ref 1.2–2.2)
Albumin: 4.4 g/dL (ref 3.8–4.8)
Alkaline Phosphatase: 81 IU/L (ref 44–121)
BUN/Creatinine Ratio: 16 (ref 12–28)
BUN: 13 mg/dL (ref 8–27)
Bilirubin Total: 0.4 mg/dL (ref 0.0–1.2)
CO2: 25 mmol/L (ref 20–29)
Calcium: 9.6 mg/dL (ref 8.7–10.3)
Chloride: 96 mmol/L (ref 96–106)
Creatinine, Ser: 0.83 mg/dL (ref 0.57–1.00)
GFR calc Af Amer: 83 mL/min/1.73 (ref >59)
GFR calc non Af Amer: 72 mL/min/1.73 (ref >59)
Globulin, Total: 3.5 g/dL (ref 1.5–4.5)
Glucose: 94 mg/dL (ref 65–99)
Potassium: 3.3 mmol/L — ABNORMAL LOW (ref 3.5–5.2)
Sodium: 135 mmol/L (ref 134–144)
Total Protein: 7.9 g/dL (ref 6.0–8.5)

## 2019-10-07 LAB — CBC WITH DIFFERENTIAL/PLATELET
Basophils Absolute: 0 10*3/uL (ref 0.0–0.2)
Basos: 0 %
EOS (ABSOLUTE): 0 10*3/uL (ref 0.0–0.4)
Eos: 1 %
Hematocrit: 38.8 % (ref 34.0–46.6)
Hemoglobin: 13.4 g/dL (ref 11.1–15.9)
Immature Grans (Abs): 0 10*3/uL (ref 0.0–0.1)
Immature Granulocytes: 0 %
Lymphocytes Absolute: 3.1 10*3/uL (ref 0.7–3.1)
Lymphs: 50 %
MCH: 29.6 pg (ref 26.6–33.0)
MCHC: 34.5 g/dL (ref 31.5–35.7)
MCV: 86 fL (ref 79–97)
Monocytes Absolute: 0.6 10*3/uL (ref 0.1–0.9)
Monocytes: 11 %
Neutrophils Absolute: 2.3 10*3/uL (ref 1.4–7.0)
Neutrophils: 38 %
Platelets: 226 10*3/uL (ref 150–450)
RBC: 4.53 x10E6/uL (ref 3.77–5.28)
RDW: 13.1 % (ref 11.7–15.4)
WBC: 6.1 10*3/uL (ref 3.4–10.8)

## 2019-10-09 ENCOUNTER — Other Ambulatory Visit (INDEPENDENT_AMBULATORY_CARE_PROVIDER_SITE_OTHER): Payer: Self-pay | Admitting: Primary Care

## 2019-10-09 DIAGNOSIS — E7800 Pure hypercholesterolemia, unspecified: Secondary | ICD-10-CM

## 2019-10-09 DIAGNOSIS — E876 Hypokalemia: Secondary | ICD-10-CM

## 2019-10-09 MED ORDER — POTASSIUM CHLORIDE CRYS ER 10 MEQ PO TBCR: 20.0000 meq | EXTENDED_RELEASE_TABLET | Freq: Every day | ORAL | 0 refills | Status: DC

## 2019-10-09 MED ORDER — ATORVASTATIN CALCIUM 10 MG PO TABS: 10.0000 mg | ORAL_TABLET | Freq: Every day | ORAL | 3 refills | Status: DC

## 2019-10-10 ENCOUNTER — Telehealth (INDEPENDENT_AMBULATORY_CARE_PROVIDER_SITE_OTHER): Payer: Self-pay

## 2019-10-10 NOTE — Telephone Encounter (Signed)
-----   Message from Grayce Sessions, NP sent at 10/09/2019  5:39 PM EDT ----- Low potassium sent in K+ supplement and LDL elevated sent in atorvastatin 10mg

## 2019-10-10 NOTE — Telephone Encounter (Signed)
Patient is aware that her potassium is low, supplement sent in. Cholesterol is elevated, atorvastatin sent in. She verbalized understanding of results. Maryjean Morn, CMA

## 2019-10-13 ENCOUNTER — Telehealth (INDEPENDENT_AMBULATORY_CARE_PROVIDER_SITE_OTHER): Payer: Medicare HMO | Admitting: Primary Care

## 2019-10-13 ENCOUNTER — Encounter (INDEPENDENT_AMBULATORY_CARE_PROVIDER_SITE_OTHER): Payer: Self-pay | Admitting: Primary Care

## 2019-10-13 ENCOUNTER — Other Ambulatory Visit: Payer: Self-pay

## 2019-10-13 DIAGNOSIS — I10 Essential (primary) hypertension: Secondary | ICD-10-CM | POA: Diagnosis not present

## 2019-10-13 NOTE — Progress Notes (Signed)
Telephone Note  I connected with Joann Vang on 10/13/19 at  9:10 AM EDT by telephone and verified that I am speaking with the correct person using two identifiers.  Patient is at home.  I discussed the limitations, risks, security and privacy concerns of performing an evaluation and management service by telephone and the availability of in person appointments. I also discussed with the patient that there may be a patient responsible charge related to this service. The patient expressed understanding and agreed to proceed. Grayce Sessions is at [facility] office  History of Present Illness: [name] is a 69 year old female having a blood pressure follow-up. Bp today 125/81. She endorses taking all her medications daily. She has taken medication Systolic ranges 106-133 and  Diastolic ranges 74-81 Past Medical History:  Diagnosis Date  . Fatty liver   . GERD (gastroesophageal reflux disease)    none recent  . Headache    migraines, once a year  . Hypertension    Current Outpatient Medications on File Prior to Visit  Medication Sig Dispense Refill  . atorvastatin (LIPITOR) 10 MG tablet Take 1 tablet (10 mg total) by mouth daily. 90 tablet 3  . chlorthalidone (HYGROTON) 25 MG tablet Take 1 tablet (25 mg total) by mouth daily. 90 tablet 1  . potassium chloride SA (KLOR-CON) 10 MEQ tablet Take 2 tablets (20 mEq total) by mouth daily. 90 tablet 0  . Telmisartan-amLODIPine 40-5 MG TABS Take 0.5 tablets by mouth daily. 90 tablet 1   No current facility-administered medications on file prior to visit.   Observations/Objective: Review of Systems  All other systems reviewed and are negative. . Assessment and Plan: Nelsie was seen today for hypertension.  Diagnoses and all orders for this visit:  Essential hypertension Blood pressures controlled on current medications Telmisartan-amLODIPine 40/5 daily ,chlorthalidone 25 mg daily and amlodipine 10 mg daily will continue.  She is  monitoring changing her sodium intake and exercising as tolerated.   Follow Up Instructions:    I discussed the assessment and treatment plan with the patient. The patient was provided an opportunity to ask questions and all were answered. The patient agreed with the plan and demonstrated an understanding of the instructions.   The patient was advised to call back or seek an in-person evaluation if the symptoms worsen or if the condition fails to improve as anticipated.  I provided 10 minutes of non-face-to-face time during this encounter.   Grayce Sessions, NP

## 2019-10-13 NOTE — Progress Notes (Signed)
125/81 Bp today  She has taken medication  Systolic ranges 106-133 Diastolic ranges 74-81

## 2019-10-18 ENCOUNTER — Telehealth (INDEPENDENT_AMBULATORY_CARE_PROVIDER_SITE_OTHER): Payer: Self-pay | Admitting: Primary Care

## 2019-10-18 NOTE — Telephone Encounter (Signed)
Patient is aware that she was informed on 10/5 that cholesterol was elevated and atorvastatin had been sent. She states that she is not going to take the medication. Diet and exercise can fix her elevated cholesterol. Advised patient on the risks of stroke and heart attack due to elevated cholesterol. She still refused to take medication.

## 2019-10-18 NOTE — Telephone Encounter (Signed)
Pt stated she had a visit and was sent meds but she noticed Atorvastatin and stated she never had that and thought her meds were staying the same /Pt would like to speak with provider or nurse about this medication / please advise

## 2020-01-09 ENCOUNTER — Telehealth: Payer: Self-pay

## 2020-01-09 ENCOUNTER — Other Ambulatory Visit: Payer: Self-pay

## 2020-01-09 ENCOUNTER — Encounter (INDEPENDENT_AMBULATORY_CARE_PROVIDER_SITE_OTHER): Payer: Medicare HMO | Admitting: Primary Care

## 2020-01-11 ENCOUNTER — Other Ambulatory Visit: Payer: Self-pay

## 2020-01-11 ENCOUNTER — Telehealth (INDEPENDENT_AMBULATORY_CARE_PROVIDER_SITE_OTHER): Payer: Medicare HMO | Admitting: Primary Care

## 2020-01-11 DIAGNOSIS — I10 Essential (primary) hypertension: Secondary | ICD-10-CM | POA: Diagnosis not present

## 2020-01-11 DIAGNOSIS — E7800 Pure hypercholesterolemia, unspecified: Secondary | ICD-10-CM

## 2020-01-11 DIAGNOSIS — E876 Hypokalemia: Secondary | ICD-10-CM | POA: Diagnosis not present

## 2020-01-11 MED ORDER — ATORVASTATIN CALCIUM 10 MG PO TABS: 10.0000 mg | ORAL_TABLET | Freq: Every day | ORAL | 3 refills | Status: DC

## 2020-01-11 MED ORDER — TELMISARTAN-AMLODIPINE 40-5 MG PO TABS: 0.5000 | ORAL_TABLET | Freq: Every day | ORAL | 1 refills | Status: DC

## 2020-01-11 MED ORDER — CHLORTHALIDONE 25 MG PO TABS: 25.0000 mg | ORAL_TABLET | Freq: Every day | ORAL | 1 refills | Status: DC

## 2020-01-11 NOTE — Progress Notes (Signed)
Telephone Note  I connected with Joann Vang on 01/11/20 at 11:10 AM EST by telephone and verified that I am speaking with the correct person using two identifiers.  Location: Patient: Home  Provider: Kerin Vang   I discussed the limitations, risks, security and privacy concerns of performing an evaluation and management service by telephone and the availability of in person appointments. I also discussed with the patient that there may be a patient responsible charge related to this service. The patient expressed understanding and agreed to proceed.   History of Present Illness: [name] is a 70 year old very indendent female initially presented to the off for her visit . Appointment  CHANGE TO tele . PCP unavailable. She denies Denies shortness of breath, headaches, chest pain or lower extremity edema. Blood pressure readings have range from 893-406 systolic and diastolic stable at 79.  Observations/Objective: Pertinent positive and negative noted in HPI.   Assessment and Plan: Joann Vang was seen today for blood pressure check.  Diagnoses and all orders for this visit:  Hypokalemia -     CMP14+EGFR; Future  Essential hypertension -     chlorthalidone (HYGROTON) 25 MG tablet; Take 1 tablet (25 mg total) by mouth daily. -     Telmisartan-amLODIPine 40-5 MG TABS; Take 0.5 tablets by mouth daily. -     CMP14+EGFR; Future  Elevated LDL cholesterol level -     atorvastatin (LIPITOR) 10 MG tablet; Take 1 tablet (10 mg total) by mouth daily. -     Lipid Panel    Follow Up Instructions:    I discussed the assessment and treatment plan with the patient. The patient was provided an opportunity to ask questions and all were answered. The patient agreed with the plan and demonstrated an understanding of the instructions.   The patient was advised to call back or seek an in-person evaluation if the symptoms worsen or if the condition fails to improve as anticipated.  I  provided 16 minutes of non-face-to-face time during this encounter.   Joann Perna, NP

## 2020-01-11 NOTE — Progress Notes (Signed)
I connected with  Joann Vang on 01/11/20 by a video enabled telemedicine application and verified that I am speaking with the correct person using two identifiers.   I discussed the limitations of evaluation and management by telemedicine. The patient expressed understanding and agreed to proceed.    Pt has taken BP medications this morning but has not checked blood pressure  121/79 on 1/5 128/79 1/4 115/79 1/3  All reading are with medication  Will be due for refills in February

## 2020-01-15 ENCOUNTER — Other Ambulatory Visit (INDEPENDENT_AMBULATORY_CARE_PROVIDER_SITE_OTHER): Payer: Medicare HMO

## 2020-01-15 ENCOUNTER — Other Ambulatory Visit: Payer: Self-pay

## 2020-01-15 DIAGNOSIS — I10 Essential (primary) hypertension: Secondary | ICD-10-CM

## 2020-01-15 DIAGNOSIS — E876 Hypokalemia: Secondary | ICD-10-CM

## 2020-01-16 LAB — CMP14+EGFR
ALT: 23 IU/L (ref 0–32)
AST: 22 IU/L (ref 0–40)
Albumin/Globulin Ratio: 1.2 (ref 1.2–2.2)
Albumin: 4.4 g/dL (ref 3.8–4.8)
Alkaline Phosphatase: 85 IU/L (ref 44–121)
BUN/Creatinine Ratio: 10 — ABNORMAL LOW (ref 12–28)
BUN: 9 mg/dL (ref 8–27)
Bilirubin Total: 0.6 mg/dL (ref 0.0–1.2)
CO2: 25 mmol/L (ref 20–29)
Calcium: 10 mg/dL (ref 8.7–10.3)
Chloride: 99 mmol/L (ref 96–106)
Creatinine, Ser: 0.88 mg/dL (ref 0.57–1.00)
GFR calc Af Amer: 77 mL/min/{1.73_m2} (ref >59)
GFR calc non Af Amer: 67 mL/min/{1.73_m2} (ref >59)
Globulin, Total: 3.6 g/dL (ref 1.5–4.5)
Glucose: 88 mg/dL (ref 65–99)
Potassium: 3.1 mmol/L — ABNORMAL LOW (ref 3.5–5.2)
Sodium: 139 mmol/L (ref 134–144)
Total Protein: 8 g/dL (ref 6.0–8.5)

## 2020-01-16 NOTE — Progress Notes (Signed)
error 

## 2020-01-22 ENCOUNTER — Other Ambulatory Visit (INDEPENDENT_AMBULATORY_CARE_PROVIDER_SITE_OTHER): Payer: Self-pay | Admitting: Primary Care

## 2020-01-22 ENCOUNTER — Telehealth (INDEPENDENT_AMBULATORY_CARE_PROVIDER_SITE_OTHER): Payer: Self-pay

## 2020-01-22 DIAGNOSIS — E876 Hypokalemia: Secondary | ICD-10-CM

## 2020-01-22 MED ORDER — POTASSIUM CHLORIDE CRYS ER 10 MEQ PO TBCR: 20.0000 meq | EXTENDED_RELEASE_TABLET | Freq: Every day | ORAL | 0 refills | Status: DC

## 2020-01-22 NOTE — Telephone Encounter (Signed)
Patient aware that labs normal except potassium still low; supplement refilled. Maryjean Morn, CMA

## 2020-01-22 NOTE — Telephone Encounter (Signed)
-----   Message from Grayce Sessions, NP sent at 01/22/2020 12:19 PM EST ----- Labs were normal except K+ remains low refilled supplement . Increase foods Potassium Content of Foods  The body needs potassium to control blood pressure and to keep the muscles and nervous system healthy. Here are some healthy foods below that are high in potassium. Also you can get the white label salt of "NO SALT" salt substitute, 1/4 teaspoon of this is equivalent to potassium.   FOODS AND DRINKS HIGH IN POTASSIUM FOODS MODERATE IN POTASSIUM  Fruits Avocado (cubed),  c / 50 g. Banana (sliced), 75 g. Cantaloupe (cubed), 80 g. Honeydew, 1 wedge / 85 g. Kiwi (sliced), 90 g. Nectarine, 1 small / 129 g. Orange, 1 medium / 131 g. Vegetables Artichoke,  of a medium / 64 g. Asparagus (boiled), 90 g.. Broccoli (boiled), 78 g. Brussels sprout (boiled), 78 g. Butternut squash (baked), 103 g. Chickpea (cooked), 82 g. Green peas (cooked), 80 g. Kidney beans (cooked), 5 tbsp / 55 g. Lima beans (cooked),  c / 43 g. Navy beans (cooked),  c / 61 g. Spinach (cooked),  c / 45 g. Sweet potato (baked),  c / 50 g. Tomato (chopped or sliced), 90 g. Vegetable juice. White mushrooms (cooked), 78 g. Yam (cooked or baked),  c / 34 g. Zucchini squash (boiled), 90 g. Other Foods and Drinks Almonds (whole),  c / 36 g. Fish, 3 oz / 85 g. Nonfat fruit variety yogurt, 123 g. Pistachio nuts, 1 oz / 28 g. Pumpkin seeds, 1 oz / 28 g. Red meat (broiled, cooked, grilled), 3 oz / 85 g. Scallops (steamed), 3 oz / 85 g. Spaghetti sauce,  c / 66 g. Sunflower seeds (dry roasted), 1 oz / 28 g. Veggie burger, 1 patty / 70 g. Fruits Grapefruit,  of the fruit / 123 g Plums (sliced), 83 g. Tangerine, 1 large / 120 g. Vegetables Carrots (boiled), 78 g. Carrots (sliced), 61 g. Rhubarb (cooked with sugar), 120 g. Rutabaga (cooked), 120 g. Yellow snap beans (cooked), 63 g. Other Foods and Drinks  Chicken breast  (roasted and chopped),  c / 70 g. Pita bread, 1 large / 64 g. Shrimp (steamed), 4 oz / 113 g. Swiss cheese (diced), 70 g.

## 2020-02-01 ENCOUNTER — Other Ambulatory Visit: Payer: Self-pay | Admitting: General Surgery

## 2020-02-01 DIAGNOSIS — K7689 Other specified diseases of liver: Secondary | ICD-10-CM

## 2020-02-18 ENCOUNTER — Other Ambulatory Visit: Payer: Self-pay

## 2020-02-18 ENCOUNTER — Ambulatory Visit
Admission: RE | Admit: 2020-02-18 | Discharge: 2020-02-18 | Disposition: A | Discharge status: Home / Self Care | Payer: Medicare HMO | Source: Ambulatory Visit | Attending: General Surgery | Admitting: General Surgery

## 2020-02-18 DIAGNOSIS — J449 Chronic obstructive pulmonary disease, unspecified: Secondary | ICD-10-CM | POA: Diagnosis not present

## 2020-02-18 DIAGNOSIS — K8020 Calculus of gallbladder without cholecystitis without obstruction: Secondary | ICD-10-CM | POA: Diagnosis not present

## 2020-02-18 DIAGNOSIS — K7689 Other specified diseases of liver: Secondary | ICD-10-CM

## 2020-02-18 MED ORDER — GADOBENATE DIMEGLUMINE 529 MG/ML IV SOLN
20.0000 mL | Freq: Once | INTRAVENOUS | Status: AC | PRN
  Administered 2020-02-18: 20 mL via INTRAVENOUS

## 2020-02-20 ENCOUNTER — Other Ambulatory Visit: Payer: Medicare HMO

## 2020-04-05 ENCOUNTER — Encounter (INDEPENDENT_AMBULATORY_CARE_PROVIDER_SITE_OTHER): Payer: Self-pay | Admitting: Primary Care

## 2020-04-05 ENCOUNTER — Other Ambulatory Visit: Payer: Self-pay

## 2020-04-05 ENCOUNTER — Ambulatory Visit (INDEPENDENT_AMBULATORY_CARE_PROVIDER_SITE_OTHER): Payer: Medicare HMO | Admitting: Primary Care

## 2020-04-05 VITALS — BP 149/90 | HR 105 | Temp 97.2°F | Ht 63.0 in | Wt 156.8 lb

## 2020-04-05 DIAGNOSIS — E876 Hypokalemia: Secondary | ICD-10-CM

## 2020-04-05 DIAGNOSIS — Z0130 Encounter for examination of blood pressure without abnormal findings: Secondary | ICD-10-CM

## 2020-04-05 NOTE — Progress Notes (Signed)
Established Patient Office Visit  Subjective:  Patient ID: Joann Vang, female    DOB: [birth year]  Age: 70 y.o. MRN: [id number]  CC:  Chief Complaint  Patient presents with  . Blood Pressure Check    HPI Joann Vang is a 70 year old female who presents for hypertension management.Denies shortness of breath, headaches, chest pain or lower extremity edema, sudden onset, vision changes, unilateral weakness, dizziness, paresthesias.  She monitors her blood pressure at home and her readings are good her systolic ranges from 110-126 and diastolic ranges from 70-78 which is at goal of less than 140/90 calculate to age to.  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 Past Medical History:  Diagnosis Date  . Fatty liver   . GERD (gastroesophageal reflux disease)    none recent  . Headache    migraines, once a year  . Hypertension     Past Surgical History:  Procedure Laterality Date  . TONSILLECTOMY    . TUBAL LIGATION      Family History  Problem Relation Age of Onset  . Congestive Heart Failure Mother   . Kidney failure Father   . Breast cancer Sister 67    Social History   Socioeconomic History  . Marital status: Single    Spouse name: Not on file  . Number of children: Not on file  . Years of education: Not on file  . Highest education level: Not on file  Occupational History  . Not on file  Tobacco Use  . Smoking status: Former Smoker    Quit date: 04/10/1995    Years since quitting: 25.0  . Smokeless tobacco: Never Used  Substance and Sexual Activity  . Alcohol use: Yes    Comment: none for a month (as of 04/10/15)  . Drug use: No  . Sexual activity: Never  Other Topics Concern  . Not on file  Social History Narrative  . Not on file   Social Determinants of Health   Financial Resource Strain: Not on file  Food Insecurity: Not on file  Transportation Needs: Not on file  Physical Activity: Not on file  Stress: Not on file   Social Connections: Not on file  Intimate Partner Violence: Not on file    Outpatient Medications Prior to Visit  Medication Sig Dispense Refill  . atorvastatin (LIPITOR) 10 MG tablet Take 1 tablet (10 mg total) by mouth daily. 90 tablet 3  . chlorthalidone (HYGROTON) 25 MG tablet Take 1 tablet (25 mg total) by mouth daily. 90 tablet 1  . potassium chloride (KLOR-CON) 10 MEQ tablet Take 2 tablets (20 mEq total) by mouth daily. 90 tablet 0  . Telmisartan-amLODIPine 40-5 MG TABS Take 0.5 tablets by mouth daily. 90 tablet 1   No facility-administered medications prior to visit.    No Known Allergies  ROS Review of Systems  All other systems reviewed and are negative.     Objective:    Physical Exam Vitals reviewed.  Constitutional:      Appearance: Normal appearance.  HENT:     Head: Normocephalic.     Right Ear: External ear normal.     Left Ear: External ear normal.  Eyes:     Extraocular Movements: Extraocular movements intact.  Cardiovascular:     Rate and Rhythm: Normal rate and regular rhythm.  Pulmonary:     Effort: Pulmonary effort is normal.     Breath sounds: Normal breath sounds.  Abdominal:     General:  Bowel sounds are normal.     Palpations: Abdomen is soft.  Musculoskeletal:        General: Normal range of motion.     Cervical back: Normal range of motion.  Skin:    General: Skin is warm and dry.  Neurological:     Mental Status: She is alert and oriented to person, place, and time.  Psychiatric:        Mood and Affect: Mood normal.        Behavior: Behavior normal.        Thought Content: Thought content normal.        Judgment: Judgment normal.     BP (!) 149/90 (BP Location: Right Arm, Patient Position: Sitting, Cuff Size: Normal)   Pulse (!) 105   Temp (!) 97.2 F (36.2 C) (Temporal)   Ht 5\' 3"  (1.6 m)   Wt 156 lb 12.8 oz (71.1 kg)   SpO2 95%   BMI 27.78 kg/m  Wt Readings from Last 3 Encounters:  04/05/20 156 lb 12.8 oz (71.1 kg)   07/18/19 174 lb 12.8 oz (79.3 kg)  04/13/19 175 lb (79.4 kg)     Health Maintenance Due  Topic Date Due  . COVID-19 Vaccine (1) Never done    There are no preventive care reminders to display for this patient.  No results found for: TSH Lab Results  Component Value Date   WBC 6.1 10/06/2019   HGB 13.4 10/06/2019   HCT 38.8 10/06/2019   MCV 86 10/06/2019   PLT 226 10/06/2019   Lab Results  Component Value Date   NA 139 01/15/2020   K 3.1 (L) 01/15/2020   CO2 25 01/15/2020   GLUCOSE 88 01/15/2020   BUN 9 01/15/2020   CREATININE 0.88 01/15/2020   BILITOT 0.6 01/15/2020   ALKPHOS 85 01/15/2020   AST 22 01/15/2020   ALT 23 01/15/2020   PROT 8.0 01/15/2020   ALBUMIN 4.4 01/15/2020   CALCIUM 10.0 01/15/2020   Lab Results  Component Value Date   CHOL 190 10/06/2019   Lab Results  Component Value Date   HDL 45 10/06/2019   Lab Results  Component Value Date   LDLCALC 127 (H) 10/06/2019   Lab Results  Component Value Date   TRIG 99 10/06/2019   Lab Results  Component Value Date   CHOLHDL 4.2 10/06/2019   No results found for: HGBA1C    Assessment & Plan:      Joann Vang was seen today for blood pressure check.  Diagnoses and all orders for this visit:  Hypokalemia With labs indicated low potassium placed on supplements will check potassium today to determine if supplement is still needed.  Patient is taking her potassium chloride 10 mEq daily. BMP  Blood pressure check Blood pressure is slightly elevated for goal of 140/90 and review blood pressure readings at home office visit increases anxiousness and anxiety whitecoat syndrome.  No change in medication continue Telmisartan-amLODIPine 40-5 MG TABS and chlorthalidone 25 mg daily  Follow-up: No follow-ups on file.    Hilda Lias, NP

## 2020-04-05 NOTE — Patient Instructions (Signed)

## 2020-04-06 LAB — BASIC METABOLIC PANEL
BUN/Creatinine Ratio: 18 (ref 12–28)
BUN: 16 mg/dL (ref 8–27)
CO2: 25 mmol/L (ref 20–29)
Calcium: 10 mg/dL (ref 8.7–10.3)
Chloride: 98 mmol/L (ref 96–106)
Creatinine, Ser: 0.9 mg/dL (ref 0.57–1.00)
Glucose: 103 mg/dL — ABNORMAL HIGH (ref 65–99)
Potassium: 3.3 mmol/L — ABNORMAL LOW (ref 3.5–5.2)
Sodium: 139 mmol/L (ref 134–144)
eGFR: 69 mL/min/{1.73_m2} (ref >59)

## 2020-04-07 ENCOUNTER — Other Ambulatory Visit (INDEPENDENT_AMBULATORY_CARE_PROVIDER_SITE_OTHER): Payer: Self-pay | Admitting: Primary Care

## 2020-04-07 DIAGNOSIS — E876 Hypokalemia: Secondary | ICD-10-CM

## 2020-04-07 MED ORDER — POTASSIUM CHLORIDE CRYS ER 10 MEQ PO TBCR: 20.0000 meq | EXTENDED_RELEASE_TABLET | Freq: Every day | ORAL | 0 refills | Status: DC

## 2020-04-08 ENCOUNTER — Telehealth (INDEPENDENT_AMBULATORY_CARE_PROVIDER_SITE_OTHER): Payer: Self-pay | Admitting: Primary Care

## 2020-04-08 NOTE — Telephone Encounter (Signed)
Pt is calling back for lab results CB- [phone]

## 2020-04-09 ENCOUNTER — Telehealth (INDEPENDENT_AMBULATORY_CARE_PROVIDER_SITE_OTHER): Payer: Self-pay

## 2020-04-09 NOTE — Telephone Encounter (Signed)
-----   Message from Grayce Sessions, NP sent at 04/07/2020  6:24 PM EDT ----- Potassium remains low she needs to continue taking 2 K+ chloride pill daily- she can crush them to swallow easier but mix in applesauce

## 2020-04-09 NOTE — Telephone Encounter (Signed)
Patient is aware that potassium remains low. She did call to admit to not taking the supplement. She is aware that now she will need to take 2 pills daily instead of 1. Advised if she still has pills from original prescription take 2 until completed and then pick up new Rx. She verbalized understanding. Maryjean Morn, CMA

## 2020-05-31 ENCOUNTER — Other Ambulatory Visit: Payer: Self-pay | Admitting: Primary Care

## 2020-05-31 DIAGNOSIS — Z1231 Encounter for screening mammogram for malignant neoplasm of breast: Secondary | ICD-10-CM

## 2020-07-09 ENCOUNTER — Other Ambulatory Visit (INDEPENDENT_AMBULATORY_CARE_PROVIDER_SITE_OTHER): Payer: Self-pay | Admitting: Primary Care

## 2020-07-09 DIAGNOSIS — I10 Essential (primary) hypertension: Secondary | ICD-10-CM

## 2020-07-09 MED ORDER — TELMISARTAN-AMLODIPINE 40-5 MG PO TABS: 0.5000 | ORAL_TABLET | Freq: Every day | ORAL | 1 refills | Status: DC

## 2020-07-09 NOTE — Telephone Encounter (Signed)
Joann Vang from [facility].  States that have had no response to a fax to the office to get refills of:  Telmisartan-amLODIPine 40-5 MG TABS (Expired)

## 2020-07-29 ENCOUNTER — Ambulatory Visit
Admission: RE | Admit: 2020-07-29 | Discharge: 2020-07-29 | Disposition: A | Discharge status: Home / Self Care | Payer: Medicare HMO | Source: Ambulatory Visit | Attending: Primary Care | Admitting: Primary Care

## 2020-07-29 ENCOUNTER — Other Ambulatory Visit: Payer: Self-pay

## 2020-07-29 DIAGNOSIS — Z1231 Encounter for screening mammogram for malignant neoplasm of breast: Secondary | ICD-10-CM

## 2020-07-31 ENCOUNTER — Other Ambulatory Visit: Payer: Self-pay

## 2020-08-01 DIAGNOSIS — H524 Presbyopia: Secondary | ICD-10-CM | POA: Diagnosis not present

## 2020-08-12 ENCOUNTER — Ambulatory Visit (INDEPENDENT_AMBULATORY_CARE_PROVIDER_SITE_OTHER): Payer: Medicare HMO | Admitting: Nurse Practitioner

## 2020-08-12 ENCOUNTER — Encounter (INDEPENDENT_AMBULATORY_CARE_PROVIDER_SITE_OTHER): Payer: Self-pay | Admitting: Nurse Practitioner

## 2020-08-12 ENCOUNTER — Other Ambulatory Visit: Payer: Self-pay

## 2020-08-12 VITALS — BP 138/85 | HR 88 | Temp 97.3°F | Ht 63.0 in | Wt 154.2 lb

## 2020-08-12 DIAGNOSIS — I10 Essential (primary) hypertension: Secondary | ICD-10-CM | POA: Diagnosis not present

## 2020-08-12 DIAGNOSIS — E876 Hypokalemia: Secondary | ICD-10-CM | POA: Insufficient documentation

## 2020-08-12 NOTE — Patient Instructions (Signed)
HTN:  Continue: Telmisartan-amLODIPine 40-5 MG TABS and chlorthalidone 25 mg daily  Will order labs to recheck potassium  Follow up:  Follow up with Marcelino Duster in 3 months or sooner if needed  Hypertension, Adult Hypertension is another name for high blood pressure. High blood pressure forces your heart to work harder to pump blood. This can cause problems overtime. There are two numbers in a blood pressure reading. There is a top number (systolic) over a bottom number (diastolic). It is best to have a blood pressure that is below 120/80. Healthy choicescan help lower your blood pressure, or you may need medicine to help lower it. What are the causes? The cause of this condition is not known. Some conditions may be related tohigh blood pressure. What increases the risk? Smoking. Having type 2 diabetes mellitus, high cholesterol, or both. Not getting enough exercise or physical activity. Being overweight. Having too much fat, sugar, calories, or salt (sodium) in your diet. Drinking too much alcohol. Having long-term (chronic) kidney disease. Having a family history of high blood pressure. Age. Risk increases with age. Race. You may be at higher risk if you are African American. Gender. Men are at higher risk than women before age 2. After age 3, women are at higher risk than men. Having obstructive sleep apnea. Stress. What are the signs or symptoms? High blood pressure may not cause symptoms. Very high blood pressure (hypertensive crisis) may cause: Headache. Feelings of worry or nervousness (anxiety). Shortness of breath. Nosebleed. A feeling of being sick to your stomach (nausea). Throwing up (vomiting). Changes in how you see. Very bad chest pain. Seizures. How is this treated? This condition is treated by making healthy lifestyle changes, such as: Eating healthy foods. Exercising more. Drinking less alcohol. Your health care provider may prescribe medicine if  lifestyle changes are not enough to get your blood pressure under control, and if: Your top number is above 130. Your bottom number is above 80. Your personal target blood pressure may vary. Follow these instructions at home: Eating and drinking  If told, follow the DASH eating plan. To follow this plan: Fill one half of your plate at each meal with fruits and vegetables. Fill one fourth of your plate at each meal with whole grains. Whole grains include whole-wheat pasta, brown rice, and whole-grain bread. Eat or drink low-fat dairy products, such as skim milk or low-fat yogurt. Fill one fourth of your plate at each meal with low-fat (lean) proteins. Low-fat proteins include fish, chicken without skin, eggs, beans, and tofu. Avoid fatty meat, cured and processed meat, or chicken with skin. Avoid pre-made or processed food. Eat less than 1,500 mg of salt each day. Do not drink alcohol if: Your doctor tells you not to drink. You are pregnant, may be pregnant, or are planning to become pregnant. If you drink alcohol: Limit how much you use to: 0-1 drink a day for women. 0-2 drinks a day for men. Be aware of how much alcohol is in your drink. In the [place] one drink equals one 12 oz bottle of beer (355 mL), one 5 oz glass of wine (148 mL), or one 1 oz glass of hard liquor (44 mL).  Lifestyle  Work with your doctor to stay at a healthy weight or to lose weight. Ask your doctor what the best weight is for you. Get at least 30 minutes of exercise most days of the week. This may include walking, swimming, or biking. Get at least 30 minutes  of exercise that strengthens your muscles (resistance exercise) at least 3 days a week. This may include lifting weights or doing Pilates. Do not use any products that contain nicotine or tobacco, such as cigarettes, e-cigarettes, and chewing tobacco. If you need help quitting, ask your doctor. Check your blood pressure at home as told by your doctor. Keep  all follow-up visits as told by your doctor. This is important.  Medicines Take over-the-counter and prescription medicines only as told by your doctor. Follow directions carefully. Do not skip doses of blood pressure medicine. The medicine does not work as well if you skip doses. Skipping doses also puts you at risk for problems. Ask your doctor about side effects or reactions to medicines that you should watch for. Contact a doctor if you: Think you are having a reaction to the medicine you are taking. Have headaches that keep coming back (recurring). Feel dizzy. Have swelling in your ankles. Have trouble with your vision. Get help right away if you: Get a very bad headache. Start to feel mixed up (confused). Feel weak or numb. Feel faint. Have very bad pain in your: Chest. Belly (abdomen). Throw up more than once. Have trouble breathing. Summary Hypertension is another name for high blood pressure. High blood pressure forces your heart to work harder to pump blood. For most people, a normal blood pressure is less than 120/80. Making healthy choices can help lower blood pressure. If your blood pressure does not get lower with healthy choices, you may need to take medicine. This information is not intended to replace advice given to you by your health care provider. Make sure you discuss any questions you have with your healthcare provider. Document Revised: 09/01/2017 Document Reviewed: 09/01/2017 Elsevier Patient Education  2022 [employer].

## 2020-08-12 NOTE — Assessment & Plan Note (Signed)
Continue: Telmisartan-amLODIPine 40-5 MG TABS and chlorthalidone 25 mg daily  Will order labs to recheck potassium  Follow up:  Follow up with Joann Vang in 3 months or sooner if needed

## 2020-08-12 NOTE — Progress Notes (Signed)
@Patient  ID: , female    DOB: [birth year], 70 y.o.   MRN: [id number]  Chief Complaint  Patient presents with   Blood Pressure Check    Referring provider: [id number], NP  HPI  Patient presents today for a follow up visit. Patient was last seen in this office by Grayce Sessions on 04/05/20. She was seen for hypokalemia and hypertension. Patient states that she has been checking BP at home and it usually ranges around 130 systolic. She states that she is compliant with medications. She states that she ran out of potassium supplements.     No Known Allergies   There is no immunization history on file for this patient.  Past Medical History:  Diagnosis Date   Fatty liver    GERD (gastroesophageal reflux disease)    none recent   Headache    migraines, once a year   Hypertension     Tobacco History: Social History   Tobacco Use  Smoking Status Former   Types: Cigarettes   Quit date: 04/10/1995   Years since quitting: 25.3  Smokeless Tobacco Never   Counseling given: Yes   Outpatient Encounter Medications as of 08/12/2020  Medication Sig   chlorthalidone (HYGROTON) 25 MG tablet Take 1 tablet (25 mg total) by mouth daily.   Telmisartan-amLODIPine 40-5 MG TABS Take 0.5 tablets by mouth daily.   [DISCONTINUED] atorvastatin (LIPITOR) 10 MG tablet Take 1 tablet (10 mg total) by mouth daily.   [DISCONTINUED] potassium chloride (KLOR-CON) 10 MEQ tablet Take 2 tablets (20 mEq total) by mouth daily.   amLODipine (NORVASC) 2.5 MG tablet Take 2.5 mg by mouth daily.   No facility-administered encounter medications on file as of 08/12/2020.     Review of Systems  Review of Systems  Constitutional: Negative.  Negative for fatigue and fever.  HENT: Negative.    Cardiovascular: Negative.   Gastrointestinal: Negative.   Allergic/Immunologic: Negative.   Neurological: Negative.   Psychiatric/Behavioral: Negative.        Physical Exam  BP 138/85 (BP  Location: Right Arm, Patient Position: Sitting, Cuff Size: Large)   Pulse 88   Temp (!) 97.3 F (36.3 C) (Temporal)   Ht 5\' 3"  (1.6 m)   Wt 154 lb 3.2 oz (69.9 kg)   SpO2 95%   BMI 27.32 kg/m   Wt Readings from Last 5 Encounters:  08/12/20 154 lb 3.2 oz (69.9 kg)  04/05/20 156 lb 12.8 oz (71.1 kg)  07/18/19 174 lb 12.8 oz (79.3 kg)  04/13/19 175 lb (79.4 kg)  03/10/18 165 lb 9.6 oz (75.1 kg)     Physical Exam Vitals and nursing note reviewed.  Constitutional:      General: She is not in acute distress.    Appearance: She is well-developed.  Cardiovascular:     Rate and Rhythm: Normal rate and regular rhythm.  Pulmonary:     Effort: Pulmonary effort is normal.     Breath sounds: Normal breath sounds.  Musculoskeletal:     Right lower leg: No edema.     Left lower leg: No edema.  Neurological:     Mental Status: She is alert and oriented to person, place, and time.      Assessment & Plan:   Essential hypertension Continue: Telmisartan-amLODIPine 40-5 MG TABS and chlorthalidone 25 mg daily  Will order labs to recheck potassium  Follow up:  Follow up with 06/13/19 in 3 months or sooner if needed  Patient Instructions  HTN:  Continue: Telmisartan-amLODIPine 40-5 MG TABS and chlorthalidone 25 mg daily  Will order labs to recheck potassium  Follow up:  Follow up with Marcelino Duster in 3 months or sooner if needed  Hypertension, Adult Hypertension is another name for high blood pressure. High blood pressure forces your heart to work harder to pump blood. This can cause problems overtime. There are two numbers in a blood pressure reading. There is a top number (systolic) over a bottom number (diastolic). It is best to have a blood pressure that is below 120/80. Healthy choicescan help lower your blood pressure, or you may need medicine to help lower it. What are the causes? The cause of this condition is not known. Some conditions may be related tohigh blood  pressure. What increases the risk? Smoking. Having type 2 diabetes mellitus, high cholesterol, or both. Not getting enough exercise or physical activity. Being overweight. Having too much fat, sugar, calories, or salt (sodium) in your diet. Drinking too much alcohol. Having long-term (chronic) kidney disease. Having a family history of high blood pressure. Age. Risk increases with age. Race. You may be at higher risk if you are African American. Gender. Men are at higher risk than women before age 56. After age 76, women are at higher risk than men. Having obstructive sleep apnea. Stress. What are the signs or symptoms? High blood pressure may not cause symptoms. Very high blood pressure (hypertensive crisis) may cause: Headache. Feelings of worry or nervousness (anxiety). Shortness of breath. Nosebleed. A feeling of being sick to your stomach (nausea). Throwing up (vomiting). Changes in how you see. Very bad chest pain. Seizures. How is this treated? This condition is treated by making healthy lifestyle changes, such as: Eating healthy foods. Exercising more. Drinking less alcohol. Your health care provider may prescribe medicine if lifestyle changes are not enough to get your blood pressure under control, and if: Your top number is above 130. Your bottom number is above 80. Your personal target blood pressure may vary. Follow these instructions at home: Eating and drinking  If told, follow the DASH eating plan. To follow this plan: Fill one half of your plate at each meal with fruits and vegetables. Fill one fourth of your plate at each meal with whole grains. Whole grains include whole-wheat pasta, brown rice, and whole-grain bread. Eat or drink low-fat dairy products, such as skim milk or low-fat yogurt. Fill one fourth of your plate at each meal with low-fat (lean) proteins. Low-fat proteins include fish, chicken without skin, eggs, beans, and tofu. Avoid fatty meat,  cured and processed meat, or chicken with skin. Avoid pre-made or processed food. Eat less than 1,500 mg of salt each day. Do not drink alcohol if: Your doctor tells you not to drink. You are pregnant, may be pregnant, or are planning to become pregnant. If you drink alcohol: Limit how much you use to: 0-1 drink a day for women. 0-2 drinks a day for men. Be aware of how much alcohol is in your drink. In the [place] one drink equals one 12 oz bottle of beer (355 mL), one 5 oz glass of wine (148 mL), or one 1 oz glass of hard liquor (44 mL).  Lifestyle  Work with your doctor to stay at a healthy weight or to lose weight. Ask your doctor what the best weight is for you. Get at least 30 minutes of exercise most days of the week. This may include walking, swimming, or biking. Get at least 30 minutes of  exercise that strengthens your muscles (resistance exercise) at least 3 days a week. This may include lifting weights or doing Pilates. Do not use any products that contain nicotine or tobacco, such as cigarettes, e-cigarettes, and chewing tobacco. If you need help quitting, ask your doctor. Check your blood pressure at home as told by your doctor. Keep all follow-up visits as told by your doctor. This is important.  Medicines Take over-the-counter and prescription medicines only as told by your doctor. Follow directions carefully. Do not skip doses of blood pressure medicine. The medicine does not work as well if you skip doses. Skipping doses also puts you at risk for problems. Ask your doctor about side effects or reactions to medicines that you should watch for. Contact a doctor if you: Think you are having a reaction to the medicine you are taking. Have headaches that keep coming back (recurring). Feel dizzy. Have swelling in your ankles. Have trouble with your vision. Get help right away if you: Get a very bad headache. Start to feel mixed up (confused). Feel weak or numb. Feel  faint. Have very bad pain in your: Chest. Belly (abdomen). Throw up more than once. Have trouble breathing. Summary Hypertension is another name for high blood pressure. High blood pressure forces your heart to work harder to pump blood. For most people, a normal blood pressure is less than 120/80. Making healthy choices can help lower blood pressure. If your blood pressure does not get lower with healthy choices, you may need to take medicine. This information is not intended to replace advice given to you by your health care provider. Make sure you discuss any questions you have with your healthcare provider. Document Revised: 09/01/2017 Document Reviewed: 09/01/2017 Elsevier Patient Education  [address].    Ivonne Andrew, [place] 08/12/2020

## 2020-08-13 LAB — BASIC METABOLIC PANEL
BUN/Creatinine Ratio: 14 (ref 12–28)
BUN: 12 mg/dL (ref 8–27)
CO2: 22 mmol/L (ref 20–29)
Calcium: 9.6 mg/dL (ref 8.7–10.3)
Chloride: 101 mmol/L (ref 96–106)
Creatinine, Ser: 0.88 mg/dL (ref 0.57–1.00)
Glucose: 89 mg/dL (ref 65–99)
Potassium: 3.3 mmol/L — ABNORMAL LOW (ref 3.5–5.2)
Sodium: 139 mmol/L (ref 134–144)
eGFR: 71 mL/min/{1.73_m2} (ref >59)

## 2020-08-16 ENCOUNTER — Telehealth (INDEPENDENT_AMBULATORY_CARE_PROVIDER_SITE_OTHER): Payer: Self-pay

## 2020-08-16 NOTE — Telephone Encounter (Signed)
Contacted patient and had her verify date of birth. She is aware that her potassium is a little low. Advised to eat foods high in potassium and we will recheck potassium at next office visit. She verbalized understanding. Maryjean Morn, CMA

## 2020-08-16 NOTE — Telephone Encounter (Signed)
-----   Message from Ivonne Andrew, NP sent at 08/16/2020  8:47 AM EDT ----- Please call to let patient know that her potassium was a little low. Please eat foods that are high in potassium. We will recheck at next visit. Thanks.

## 2020-10-02 ENCOUNTER — Other Ambulatory Visit (INDEPENDENT_AMBULATORY_CARE_PROVIDER_SITE_OTHER): Payer: Self-pay | Admitting: Primary Care

## 2020-10-02 DIAGNOSIS — I10 Essential (primary) hypertension: Secondary | ICD-10-CM

## 2020-10-02 MED ORDER — POTASSIUM CHLORIDE CRYS ER 10 MEQ PO TBCR: 10.0000 meq | EXTENDED_RELEASE_TABLET | Freq: Every day | ORAL | 0 refills | Status: DC

## 2020-10-02 NOTE — Telephone Encounter (Signed)
Sent to PCP ?

## 2020-11-06 ENCOUNTER — Ambulatory Visit (INDEPENDENT_AMBULATORY_CARE_PROVIDER_SITE_OTHER): Payer: Medicare HMO | Admitting: Primary Care

## 2020-11-06 ENCOUNTER — Other Ambulatory Visit: Payer: Self-pay

## 2020-11-06 ENCOUNTER — Encounter (INDEPENDENT_AMBULATORY_CARE_PROVIDER_SITE_OTHER): Payer: Self-pay | Admitting: Primary Care

## 2020-11-06 VITALS — BP 140/84 | HR 64 | Temp 97.3°F | Ht 63.0 in | Wt 156.8 lb

## 2020-11-06 DIAGNOSIS — I10 Essential (primary) hypertension: Secondary | ICD-10-CM

## 2020-11-06 DIAGNOSIS — Z760 Encounter for issue of repeat prescription: Secondary | ICD-10-CM

## 2020-11-06 MED ORDER — TELMISARTAN-AMLODIPINE 40-5 MG PO TABS: 0.5000 | ORAL_TABLET | Freq: Every day | ORAL | 1 refills | Status: DC

## 2020-11-06 NOTE — Progress Notes (Signed)
[facility]   [name] is a 70 y.o. female presents for hypertension evaluation, Denies shortness of breath, headaches, chest pain or lower extremity edema, sudden onset, vision changes, unilateral weakness, dizziness, paresthesias   Patient reports adherence with medications.  Dietary habits include: monitor sodium intake  Exercise habits include:yes walk  Family / Social history: T2D -father and mother CHF    Past Medical History:  Diagnosis Date   Fatty liver    GERD (gastroesophageal reflux disease)    none recent   Headache    migraines, once a year   Hypertension    Past Surgical History:  Procedure Laterality Date   TONSILLECTOMY     TUBAL LIGATION     No Known Allergies Current Outpatient Medications on File Prior to Visit  Medication Sig Dispense Refill   chlorthalidone (HYGROTON) 25 MG tablet TAKE ONE TABLET BY MOUTH ONCE DAILY 90 tablet 1   potassium chloride SA (KLOR-CON) 10 MEQ tablet Take 1 tablet (10 mEq total) by mouth daily. 60 tablet 0   No current facility-administered medications on file prior to visit.   Social History   Socioeconomic History   Marital status: Single    Spouse name: Not on file   Number of children: Not on file   Years of education: Not on file   Highest education level: Not on file  Occupational History   Not on file  Tobacco Use   Smoking status: Former    Types: Cigarettes    Quit date: 04/10/1995    Years since quitting: 25.5   Smokeless tobacco: Never  Substance and Sexual Activity   Alcohol use: Yes    Comment: none for a month (as of 04/10/15)   Drug use: No   Sexual activity: Never  Other Topics Concern   Not on file  Social History Narrative   Not on file   Social Determinants of Health   Financial Resource Strain: Not on file  Food Insecurity: Not on file  Transportation Needs: Not on file  Physical Activity: Not on file  Stress: Not on file  Social Connections: Not on file   Intimate Partner Violence: Not on file   Family History  Problem Relation Age of Onset   Congestive Heart Failure Mother    Kidney failure Father    Breast cancer Sister 73     OBJECTIVE:  Vitals:   11/06/20 1003 11/06/20 1025  BP: (!) 166/99 140/84  Pulse: 64   Temp: (!) 97.3 F (36.3 C)   TempSrc: Temporal   SpO2: 100%   Weight: 156 lb 12.8 oz (71.1 kg)   Height: 5\' 3"  (1.6 m)    Physical exam: General: Vital signs reviewed.  Patient is well-developed and well-nourished,female in no acute distress and cooperative with exam. Head: Normocephalic and atraumatic. Eyes: EOMI, conjunctivae normal, no scleral icterus. Neck: Supple, trachea midline, normal ROM, no JVD, masses, thyromegaly, or carotid bruit present. Cardiovascular: RRR, S1 normal, S2 normal, no murmurs, gallops, or rubs. Pulmonary/Chest: Clear to auscultation bilaterally, no wheezes, rales, or rhonchi. Abdominal: Soft, non-tender, non-distended, BS +, no masses, organomegaly, or guarding present. Musculoskeletal: No joint deformities, erythema, or stiffness, ROM full and nontender. Extremities: No lower extremity edema bilaterally,  pulses symmetric and intact bilaterally. No cyanosis or clubbing. Neurological: A&O x3, Strength is normal Skin: Warm, dry and intact. No rashes or erythema. Psychiatric: Normal mood and affect. speech and behavior is normal. Cognition and memory are normal.     Review  of Systems  All other systems reviewed and are negative.  Last 3 Office BP readings: BP Readings from Last 3 Encounters:  11/06/20 140/84  08/12/20 138/85  04/05/20 (!) 149/90    BMET    Component Value Date/Time   NA 139 08/12/2020 0934   K 3.3 (L) 08/12/2020 0934   CL 101 08/12/2020 0934   CO2 22 08/12/2020 0934   GLUCOSE 89 08/12/2020 0934   BUN 12 08/12/2020 0934   CREATININE 0.88 08/12/2020 0934   CALCIUM 9.6 08/12/2020 0934   GFRNONAA 67 01/15/2020 0926   GFRAA 77 01/15/2020 0926    Renal  function: CrCl cannot be calculated (Patient's most recent lab result is older than the maximum 21 days allowed.).  Clinical ASCVD: Yes  The 10-year ASCVD risk score (Arnett DK, et al., 2019) is: 25.4%   Values used to calculate the score:     Age: 52 years     Sex: Female     Is Non-Hispanic African American: Yes     Diabetic: No     Tobacco smoker: Yes     Systolic Blood Pressure: 140 mmHg     Is BP treated: Yes     HDL Cholesterol: 45 mg/dL     Total Cholesterol: 190 mg/dL  ASCVD risk factors include- [place]   ASSESSMENT & PLAN: Joann Vang was seen today for hypertension.  Diagnoses and all orders for this visit:  Essential hypertension -     Telmisartan-amLODIPine 40-5 MG TABS; Take 0.5 tablets by mouth daily. -Counseled on lifestyle modifications for blood pressure control including reduced dietary sodium, increased exercise, weight reduction and adequate sleep. Also, educated patient about the risk for cardiovascular events, stroke and heart attack. Also counseled patient about the importance of medication adherence. If you participate in smoking, it is important to stop using tobacco as this will increase the risks associated with uncontrolled blood pressure.   -Hypertension longstanding diagnosed currently 1/2 tablet Telmisartan-amLODIPine 40-5 MG TABS daily and chlorthalidone 25 mg daily.on current medications. Patient is adherent with current medications.   Goal BP:  For patients younger than 60: Goal BP < 130/80. For patients 60 and older: Goal BP < 140/90. For patients with diabetes: Goal BP < 130/80. Your most recent BP: 140/90  Minimize salt intake. Minimize alcohol intake  Medication refill -     Telmisartan-amLODIPine 40-5 MG TABS; Take 0.5 tablets by mouth daily.   This note has been created with Education officer, environmental. Any transcriptional errors are unintentional.   Grayce Sessions, NP 11/06/2020, 10:27 AM

## 2020-12-23 ENCOUNTER — Other Ambulatory Visit (INDEPENDENT_AMBULATORY_CARE_PROVIDER_SITE_OTHER): Payer: Self-pay | Admitting: Primary Care

## 2020-12-23 NOTE — Telephone Encounter (Signed)
Sent to PCP ?

## 2021-03-28 ENCOUNTER — Other Ambulatory Visit (INDEPENDENT_AMBULATORY_CARE_PROVIDER_SITE_OTHER): Payer: Self-pay | Admitting: Primary Care

## 2021-03-28 DIAGNOSIS — I10 Essential (primary) hypertension: Secondary | ICD-10-CM

## 2021-03-31 ENCOUNTER — Other Ambulatory Visit (INDEPENDENT_AMBULATORY_CARE_PROVIDER_SITE_OTHER): Payer: Self-pay | Admitting: Primary Care

## 2021-03-31 MED ORDER — AMLODIPINE BESYLATE 2.5 MG PO TABS: ORAL_TABLET | ORAL | 1 refills | Status: DC

## 2021-03-31 NOTE — Telephone Encounter (Signed)
Routed to PCP 

## 2021-05-06 ENCOUNTER — Ambulatory Visit (INDEPENDENT_AMBULATORY_CARE_PROVIDER_SITE_OTHER): Payer: Medicare HMO | Admitting: Primary Care

## 2021-05-08 ENCOUNTER — Encounter (INDEPENDENT_AMBULATORY_CARE_PROVIDER_SITE_OTHER): Payer: Self-pay | Admitting: Primary Care

## 2021-05-08 ENCOUNTER — Ambulatory Visit (INDEPENDENT_AMBULATORY_CARE_PROVIDER_SITE_OTHER): Payer: Medicare HMO | Admitting: Primary Care

## 2021-05-08 VITALS — BP 157/91 | HR 59 | Temp 98.0°F | Ht 63.0 in | Wt 163.8 lb

## 2021-05-08 DIAGNOSIS — E876 Hypokalemia: Secondary | ICD-10-CM | POA: Diagnosis not present

## 2021-05-08 DIAGNOSIS — E7800 Pure hypercholesterolemia, unspecified: Secondary | ICD-10-CM | POA: Diagnosis not present

## 2021-05-08 DIAGNOSIS — I10 Essential (primary) hypertension: Secondary | ICD-10-CM

## 2021-05-08 NOTE — Progress Notes (Signed)
?Joann ? ?Aldona Vang, is a 71 y.o. female ? ?DJS:[id number] ? ?[id number]:[id number] ? ?DOB - 09-02-1950 ? ?Chief Complaint  ?Patient presents with  ? Hypertension  ?    ? ?Subjective:  ? ?Joann Vang is a 71 y.o. female here today for a follow up visit. She is fasting and did not take her medications today/ Bp reading from home ( 120/80- 124/84 morning) she takes her Bp meds at bedtime. Patient has No headache, No chest pain, No abdominal pain - No Nausea, No new weakness tingling or numbness, No Cough - shortness of breath ? ?No problems updated. ? ?No Known Allergies ? ?Past Medical History:  ?Diagnosis Date  ? Fatty liver   ? GERD (gastroesophageal reflux disease)   ? none recent  ? Headache   ? migraines, once a year  ? Hypertension   ? ? ?Current Outpatient Medications on File Prior to Visit  ?Medication Sig Dispense Refill  ? amLODipine (NORVASC) 2.5 MG tablet TAKE ONE TABLET BY MOUTH ONCE DAILY along with telmisartan 30 tablet 1  ? chlorthalidone (HYGROTON) 25 MG tablet TAKE ONE TABLET BY MOUTH ONCE DAILY 30 tablet 1  ? telmisartan (MICARDIS) 20 MG tablet TAKE ONE TABLET BY MOUTH ONCE DAILY 90 tablet 1  ? ?No current facility-administered medications on file prior to visit.  ?Comprehensive ROS Pertinent positive and negative noted in HPI   ? ?Objective:  ? ?Vitals:  ? 05/08/21 1006 05/08/21 1024  ?BP: (!) 171/94 (!) 157/91  ?Pulse: (!) 59 (!) 59  ?Temp: 98 ?F (36.7 ?C)   ?TempSrc: Oral   ?SpO2: 97%   ?Weight: 163 lb 12.8 oz (74.3 kg)   ?Height: '5\' 3"'  (1.6 m)   ? ? ?Exam ?General appearance : Awake, alert, not in any distress. Speech Clear. Not toxic looking ?HEENT: Atraumatic and Normocephalic, pupils equally reactive to light and accomodation ?Neck: Supple, no JVD. No cervical lymphadenopathy.  ?Chest: Good air entry bilaterally, no added sounds  ?CVS: S1 S2 regular, no murmurs.  ?Abdomen: Bowel sounds present, Non tender and not distended with no gaurding, rigidity or  rebound. ?Extremities: B/L Lower Ext shows no edema, both legs are warm to touch ?Neurology: Awake alert, and oriented X 3,  Non focal ?Skin: No Rash ? ?Data Review ?No results found for: HGBA1C ? ?Assessment & Plan  ?Joann Vang was seen today for hypertension. ? ?Diagnoses and all orders for this visit: ? ?Hypokalemia ?-     CMP14+EGFR ? ?Essential hypertension  ?We have discussed target BP range and blood pressure goal.  We discussed the importance of compliance with medical therapy and DASH diet recommended, consequences of uncontrolled hypertension discussed.  ?- continue current BP medications  ?- CMP14+EGFR ? ?Elevated LDL cholesterol level ?Patient have been counseled extensively about nutrition and exercise. Other issues discussed during this visit include: low cholesterol diet, weight control and daily exercise, foot care, annual eye examinations at Ophthalmology, importance of adherence with medications and regular follow-up. We also discussed long term complications of uncontrolled diabetes and hypertension.  ? ?Return in about 2 months (around 07/08/2021) for Bp check . ? ?The patient was given clear instructions to go to ER or return to medical center if symptoms don't improve, worsen or new problems develop. The patient verbalized understanding. The patient was told to call to get lab results if they haven't heard anything in the next week.  ? ?This note has been created with Surveyor, quantity. Any transcriptional  errors are unintentional.  ? ?Kerin Perna, NP ?05/08/2021, 10:41 AM ? ?

## 2021-05-13 LAB — CMP14+EGFR
ALT: 13 IU/L (ref 0–32)
AST: 19 IU/L (ref 0–40)
Albumin/Globulin Ratio: 1.3 (ref 1.2–2.2)
Albumin: 4.7 g/dL (ref 3.7–4.7)
Alkaline Phosphatase: 80 IU/L (ref 44–121)
BUN/Creatinine Ratio: 12 (ref 12–28)
BUN: 11 mg/dL (ref 8–27)
Bilirubin Total: 0.5 mg/dL (ref 0.0–1.2)
CO2: 26 mmol/L (ref 20–29)
Calcium: 10.2 mg/dL (ref 8.7–10.3)
Chloride: 94 mmol/L — ABNORMAL LOW (ref 96–106)
Creatinine, Ser: 0.91 mg/dL (ref 0.57–1.00)
Globulin, Total: 3.6 g/dL (ref 1.5–4.5)
Glucose: 83 mg/dL (ref 70–99)
Potassium: 3.8 mmol/L (ref 3.5–5.2)
Sodium: 136 mmol/L (ref 134–144)
Total Protein: 8.3 g/dL (ref 6.0–8.5)
eGFR: 67 mL/min/{1.73_m2} (ref >59)

## 2021-05-13 LAB — LIPID PANEL
Chol/HDL Ratio: 3.8 ratio (ref 0.0–4.4)
Cholesterol, Total: 234 mg/dL — ABNORMAL HIGH (ref 100–199)
HDL: 61 mg/dL (ref >39)
LDL Chol Calc (NIH): 162 mg/dL — ABNORMAL HIGH (ref 0–99)
Triglycerides: 67 mg/dL (ref 0–149)
VLDL Cholesterol Cal: 11 mg/dL (ref 5–40)

## 2021-05-14 ENCOUNTER — Other Ambulatory Visit (INDEPENDENT_AMBULATORY_CARE_PROVIDER_SITE_OTHER): Payer: Self-pay | Admitting: Primary Care

## 2021-05-14 DIAGNOSIS — E782 Mixed hyperlipidemia: Secondary | ICD-10-CM

## 2021-05-14 MED ORDER — ATORVASTATIN CALCIUM 20 MG PO TABS: 20.0000 mg | ORAL_TABLET | Freq: Every day | ORAL | 3 refills | Status: DC

## 2021-05-16 ENCOUNTER — Telehealth (INDEPENDENT_AMBULATORY_CARE_PROVIDER_SITE_OTHER): Payer: Self-pay

## 2021-05-16 NOTE — Telephone Encounter (Signed)
-----   Message from Grayce Sessions, NP sent at 05/14/2021  2:38 PM EDT ----- ?Labs are normal except your cholesterol.Because you have hypertension increases your risk for stroke or heart attack. Sending in a cholesterol pill atorvastatin 20mg  take at bedtime. Any problems call the office.  Healthy lifestyle diet of fruits vegetables fish nuts whole grains and low saturated fat . Foods high in cholesterol or liver, fatty meats,cheese, butter avocados, nuts and seeds, chocolate and fried foods. ? ? ? ?  ?

## 2021-05-16 NOTE — Telephone Encounter (Signed)
Patient is aware of lab results and medication being sent. She verbalized understanding. Maryjean Morn, CMA  ?

## 2021-05-22 ENCOUNTER — Other Ambulatory Visit (INDEPENDENT_AMBULATORY_CARE_PROVIDER_SITE_OTHER): Payer: Self-pay | Admitting: Primary Care

## 2021-05-22 DIAGNOSIS — I10 Essential (primary) hypertension: Secondary | ICD-10-CM

## 2021-05-22 NOTE — Telephone Encounter (Signed)
Requested Prescriptions  Pending Prescriptions Disp Refills  . chlorthalidone (HYGROTON) 25 MG tablet [Pharmacy Med Name: chlorthalidone 25 mg tablet] 30 tablet 1    Sig: TAKE ONE TABLET BY MOUTH ONCE DAILY     Cardiovascular: Diuretics - Thiazide Failed - 05/22/2021  8:03 AM      Failed - Last BP in normal range    BP Readings from Last 1 Encounters:  05/08/21 (!) 157/91         Passed - Cr in normal range and within 180 days    Creatinine, Ser  Date Value Ref Range Status  05/08/2021 0.91 0.57 - 1.00 mg/dL Final         Passed - K in normal range and within 180 days    Potassium  Date Value Ref Range Status  05/08/2021 3.8 3.5 - 5.2 mmol/L Final         Passed - Na in normal range and within 180 days    Sodium  Date Value Ref Range Status  05/08/2021 136 134 - 144 mmol/L Final         Passed - Valid encounter within last 6 months    Recent Outpatient Visits          2 weeks ago Hypokalemia   [facility] Grayce Sessions, NP   6 months ago Medication refill   [facility] Grayce Sessions, NP   9 months ago Hypokalemia   [facility] Ivonne Andrew, NP   1 year ago Hypokalemia   [facility] Grayce Sessions, NP   1 year ago Hypokalemia   [facility] Grayce Sessions, NP      Future Appointments            In 1 month Randa Evens, Kinnie Scales, NP [facility]

## 2021-06-20 ENCOUNTER — Other Ambulatory Visit: Payer: Self-pay | Admitting: Primary Care

## 2021-06-20 DIAGNOSIS — Z1231 Encounter for screening mammogram for malignant neoplasm of breast: Secondary | ICD-10-CM

## 2021-06-22 ENCOUNTER — Other Ambulatory Visit (INDEPENDENT_AMBULATORY_CARE_PROVIDER_SITE_OTHER): Payer: Self-pay | Admitting: Primary Care

## 2021-06-23 NOTE — Telephone Encounter (Signed)
Routed to PCP 

## 2021-06-24 DIAGNOSIS — E785 Hyperlipidemia, unspecified: Secondary | ICD-10-CM | POA: Diagnosis not present

## 2021-06-24 DIAGNOSIS — G3184 Mild cognitive impairment, so stated: Secondary | ICD-10-CM | POA: Diagnosis not present

## 2021-06-24 DIAGNOSIS — Z833 Family history of diabetes mellitus: Secondary | ICD-10-CM | POA: Diagnosis not present

## 2021-06-24 DIAGNOSIS — Z8249 Family history of ischemic heart disease and other diseases of the circulatory system: Secondary | ICD-10-CM | POA: Diagnosis not present

## 2021-06-24 DIAGNOSIS — Z87891 Personal history of nicotine dependence: Secondary | ICD-10-CM | POA: Diagnosis not present

## 2021-06-24 DIAGNOSIS — Z809 Family history of malignant neoplasm, unspecified: Secondary | ICD-10-CM | POA: Diagnosis not present

## 2021-06-24 DIAGNOSIS — I951 Orthostatic hypotension: Secondary | ICD-10-CM | POA: Diagnosis not present

## 2021-06-24 DIAGNOSIS — I10 Essential (primary) hypertension: Secondary | ICD-10-CM | POA: Diagnosis not present

## 2021-06-24 DIAGNOSIS — Z008 Encounter for other general examination: Secondary | ICD-10-CM | POA: Diagnosis not present

## 2021-07-04 DIAGNOSIS — H524 Presbyopia: Secondary | ICD-10-CM | POA: Diagnosis not present

## 2021-07-04 DIAGNOSIS — H43393 Other vitreous opacities, bilateral: Secondary | ICD-10-CM | POA: Diagnosis not present

## 2021-07-10 ENCOUNTER — Telehealth (INDEPENDENT_AMBULATORY_CARE_PROVIDER_SITE_OTHER): Payer: Medicare HMO | Admitting: Primary Care

## 2021-07-21 ENCOUNTER — Ambulatory Visit (INDEPENDENT_AMBULATORY_CARE_PROVIDER_SITE_OTHER): Payer: Medicare HMO | Admitting: Primary Care

## 2021-07-21 ENCOUNTER — Encounter (INDEPENDENT_AMBULATORY_CARE_PROVIDER_SITE_OTHER): Payer: Self-pay | Admitting: Primary Care

## 2021-07-21 VITALS — BP 146/86 | HR 71 | Temp 98.6°F | Ht 63.0 in | Wt 159.0 lb

## 2021-07-21 DIAGNOSIS — I10 Essential (primary) hypertension: Secondary | ICD-10-CM

## 2021-07-24 NOTE — Progress Notes (Signed)
[facility]   Joann Vang is a 71 y.o. female presents for hypertension evaluation, Denies shortness of breath, headaches, chest pain or lower extremity edema, sudden onset, vision changes, unilateral weakness, dizziness, paresthesias   Patient reports adherence with medications.  Dietary habits include: monitoring sodium intake Exercise habits include:walking as tolerated Family / Social history: Mother -CHF   Past Medical History:  Diagnosis Date   Fatty liver    GERD (gastroesophageal reflux disease)    none recent   Headache    migraines, once a year   Hypertension    Past Surgical History:  Procedure Laterality Date   TONSILLECTOMY     TUBAL LIGATION     No Known Allergies Current Outpatient Medications on File Prior to Visit  Medication Sig Dispense Refill   amLODipine (NORVASC) 2.5 MG tablet TAKE ONE TABLET BY MOUTH ONCE DAILY along with telmisartan 30 tablet 1   atorvastatin (LIPITOR) 20 MG tablet Take 1 tablet (20 mg total) by mouth daily. 90 tablet 3   chlorthalidone (HYGROTON) 25 MG tablet TAKE ONE TABLET BY MOUTH ONCE DAILY 90 tablet 0   telmisartan (MICARDIS) 20 MG tablet TAKE ONE TABLET BY MOUTH ONCE DAILY 90 tablet 1   No current facility-administered medications on file prior to visit.   Social History   Socioeconomic History   Marital status: Single    Spouse name: Not on file   Number of children: Not on file   Years of education: Not on file   Highest education level: Not on file  Occupational History   Not on file  Tobacco Use   Smoking status: Former    Types: Cigarettes    Quit date: 04/10/1995    Years since quitting: 26.3   Smokeless tobacco: Never  Substance and Sexual Activity   Alcohol use: Yes    Comment: none for a month (as of 04/10/15)   Drug use: No   Sexual activity: Never  Other Topics Concern   Not on file  Social History Narrative   Not on file   Social Determinants of Health   Financial Resource  Strain: Not on file  Food Insecurity: Not on file  Transportation Needs: Not on file  Physical Activity: Not on file  Stress: Not on file  Social Connections: Not on file  Intimate Partner Violence: Not on file   Family History  Problem Relation Age of Onset   Congestive Heart Failure Mother    Kidney failure Father    Breast cancer Sister 57     OBJECTIVE:  Vitals:   07/21/21 1023 07/21/21 1034  BP: (!) 156/91 (!) 146/86  Pulse: 71   Temp: 98.6 F (37 C)   TempSrc: Oral   SpO2: 97%   Weight: 159 lb (72.1 kg)   Height: 5\' 3"  (1.6 m)     Physical Exam Constitutional:      Appearance: Normal appearance.  HENT:     Head: Normocephalic.     Right Ear: External ear normal.     Left Ear: External ear normal.     Nose: Nose normal.  Cardiovascular:     Rate and Rhythm: Normal rate and regular rhythm.  Pulmonary:     Effort: Pulmonary effort is normal.     Breath sounds: Normal breath sounds.  Abdominal:     General: Bowel sounds are normal.     Palpations: Abdomen is soft.  Musculoskeletal:        General: Normal range of motion.  Cervical back: Normal range of motion.  Skin:    General: Skin is warm and dry.  Neurological:     Mental Status: She is alert and oriented to person, place, and time.  Psychiatric:        Mood and Affect: Mood normal.        Behavior: Behavior normal.        Thought Content: Thought content normal.    ROS Comprehensive ROS Pertinent positive and negative noted in HPI   Last 3 Office BP readings: BP Readings from Last 3 Encounters:  07/21/21 (!) 146/86  05/08/21 (!) 157/91  11/06/20 140/84    BMET    Component Value Date/Time   NA 136 05/08/2021 1049   K 3.8 05/08/2021 1049   CL 94 (L) 05/08/2021 1049   CO2 26 05/08/2021 1049   GLUCOSE 83 05/08/2021 1049   BUN 11 05/08/2021 1049   CREATININE 0.91 05/08/2021 1049   CALCIUM 10.2 05/08/2021 1049   GFRNONAA 67 01/15/2020 0926   GFRAA 77 01/15/2020 0926    Renal  function: CrCl cannot be calculated (Patient's most recent lab result is older than the maximum 21 days allowed.).  Clinical ASCVD: Yes  The 10-year ASCVD risk score (Arnett DK, et al., 2019) is: 33.3%   Values used to calculate the score:     Age: 4 years     Sex: Female     Is Non-Hispanic African American: Yes     Diabetic: No     Tobacco smoker: Yes     Systolic Blood Pressure: 146 mmHg     Is BP treated: Yes     HDL Cholesterol: 61 mg/dL     Total Cholesterol: 234 mg/dL  ASCVD risk factors include- [place]   ASSESSMENT & PLAN: Vercie was seen today for hypertension.  Diagnoses and all orders for this visit:  Essential hypertension -Counseled on lifestyle modifications for blood pressure control including reduced dietary sodium, increased exercise, weight reduction and adequate sleep. Also, educated patient about the risk for cardiovascular events, stroke and heart attack. Also counseled patient about the importance of medication adherence. If you participate in smoking, it is important to stop using tobacco as this will increase the risks associated with uncontrolled blood pressure.  Goal BP:  For patients younger than 60: Goal BP < 130/80. For patients 60 and older: Goal BP < 140/90. For patients with diabetes: Goal BP < 130/80. Your most recent BP: 146/86  Minimize salt intake. Minimize alcohol intake    This note has been created with Education officer, environmental. Any transcriptional errors are unintentional.   Grayce Sessions, NP 07/24/2021, 12:18 PM

## 2021-07-30 ENCOUNTER — Other Ambulatory Visit (INDEPENDENT_AMBULATORY_CARE_PROVIDER_SITE_OTHER): Payer: Self-pay | Admitting: Primary Care

## 2021-07-30 DIAGNOSIS — E782 Mixed hyperlipidemia: Secondary | ICD-10-CM

## 2021-07-30 DIAGNOSIS — I10 Essential (primary) hypertension: Secondary | ICD-10-CM

## 2021-07-31 ENCOUNTER — Ambulatory Visit
Admission: RE | Admit: 2021-07-31 | Discharge: 2021-07-31 | Disposition: A | Discharge status: Home / Self Care | Payer: Medicare HMO | Source: Ambulatory Visit | Attending: Primary Care | Admitting: Primary Care

## 2021-07-31 DIAGNOSIS — Z1231 Encounter for screening mammogram for malignant neoplasm of breast: Secondary | ICD-10-CM | POA: Diagnosis not present

## 2021-10-19 ENCOUNTER — Other Ambulatory Visit (INDEPENDENT_AMBULATORY_CARE_PROVIDER_SITE_OTHER): Payer: Self-pay | Admitting: Primary Care

## 2022-01-22 ENCOUNTER — Other Ambulatory Visit (INDEPENDENT_AMBULATORY_CARE_PROVIDER_SITE_OTHER): Payer: Self-pay | Admitting: Primary Care

## 2022-01-22 NOTE — Telephone Encounter (Signed)
Requested Prescriptions  Pending Prescriptions Disp Refills   amLODipine (NORVASC) 2.5 MG tablet [Pharmacy Med Name: amlodipine 2.5 mg tablet] 90 tablet 0    Sig: TAKE ONE TABLET BY MOUTH ONCE DAILY along with telmisartan     Cardiovascular: Calcium Channel Blockers 2 Failed - 01/22/2022 12:03 PM      Failed - Last BP in normal range    BP Readings from Last 1 Encounters:  07/21/21 (!) 146/86         Failed - Valid encounter within last 6 months    Recent Outpatient Visits           6 months ago Essential hypertension   [place], Michelle P, NP   8 months ago Hypokalemia   [place] Kerin Perna, NP   1 year ago Medication refill   [place] Kerin Perna, NP   1 year ago Hypokalemia   [place] F[place] Foy, NP   1 year ago Hypokalemia   [place] Kerin Perna, NP       Future Appointments             In 4 days Kerin Perna, NP [facility]            Passed - Last Heart Rate in normal range    Pulse Readings from Last 1 Encounters:  07/21/21 71

## 2022-01-26 ENCOUNTER — Telehealth (INDEPENDENT_AMBULATORY_CARE_PROVIDER_SITE_OTHER): Payer: Self-pay

## 2022-01-26 ENCOUNTER — Ambulatory Visit (INDEPENDENT_AMBULATORY_CARE_PROVIDER_SITE_OTHER): Payer: Medicare HMO | Admitting: Primary Care

## 2022-01-26 ENCOUNTER — Encounter (INDEPENDENT_AMBULATORY_CARE_PROVIDER_SITE_OTHER): Payer: Self-pay | Admitting: Primary Care

## 2022-01-26 VITALS — BP 122/82 | HR 73 | Ht 63.0 in | Wt 159.0 lb

## 2022-01-26 DIAGNOSIS — I10 Essential (primary) hypertension: Secondary | ICD-10-CM | POA: Diagnosis not present

## 2022-01-26 DIAGNOSIS — Z760 Encounter for issue of repeat prescription: Secondary | ICD-10-CM

## 2022-01-26 DIAGNOSIS — E782 Mixed hyperlipidemia: Secondary | ICD-10-CM

## 2022-01-26 DIAGNOSIS — Z79899 Other long term (current) drug therapy: Secondary | ICD-10-CM

## 2022-01-26 NOTE — Progress Notes (Unsigned)
[facility]   Mr. Joann Vang is a 72 y.o. female presents for hypertension evaluation, Denies shortness of breath, headaches, chest pain or lower extremity edema, sudden onset, vision changes, unilateral weakness, dizziness, paresthesias   Patient reports adherence with medications.  Dietary habits include: Monitoring her sodium intake and eliminated canned goods processed foods Exercise habits include: Walking as tolerated Family / Social history: Significant family history mother congestive heart failure, father kidney disease and sister breast cancer   Past Medical History:  Diagnosis Date   Fatty liver    GERD (gastroesophageal reflux disease)    none recent   Headache    migraines, once a year   Hypertension    Past Surgical History:  Procedure Laterality Date   TONSILLECTOMY     TUBAL LIGATION     No Known Allergies Current Outpatient Medications on File Prior to Visit  Medication Sig Dispense Refill   amLODipine (NORVASC) 2.5 MG tablet TAKE ONE TABLET BY MOUTH ONCE DAILY along with telmisartan 90 tablet 0   atorvastatin (LIPITOR) 20 MG tablet TAKE ONE TABLET BY MOUTH ONCE DAILY 90 tablet 2   chlorthalidone (HYGROTON) 25 MG tablet TAKE ONE TABLET BY MOUTH ONCE DAILY 90 tablet 2   telmisartan (MICARDIS) 20 MG tablet TAKE ONE TABLET BY MOUTH ONCE DAILY 90 tablet 2   No current facility-administered medications on file prior to visit.   Social History   Socioeconomic History   Marital status: Single    Spouse name: Not on file   Number of children: Not on file   Years of education: Not on file   Highest education level: Not on file  Occupational History   Not on file  Tobacco Use   Smoking status: Former    Types: Cigarettes    Quit date: 04/10/1995    Years since quitting: 26.8   Smokeless tobacco: Never  Substance and Sexual Activity   Alcohol use: Yes    Comment: none for a month (as of 04/10/15)   Drug use: No   Sexual activity: Never   Other Topics Concern   Not on file  Social History Narrative   Not on file   Social Determinants of Health   Financial Resource Strain: Not on file  Food Insecurity: Not on file  Transportation Needs: Not on file  Physical Activity: Not on file  Stress: Not on file  Social Connections: Not on file  Intimate Partner Violence: Not on file   Family History  Problem Relation Age of Onset   Congestive Heart Failure Mother    Kidney failure Father    Breast cancer Sister 30     OBJECTIVE:  Vitals:   01/26/22 0913 01/26/22 0939  BP: (Abnormal) 161/93 122/82  Pulse: 73   SpO2: 97%   Weight: 159 lb (72.1 kg)   Height: 5\' 3"  (1.6 m)     Physical Exam Vitals reviewed.  Constitutional:      Comments: Overweight  HENT:     Head: Normocephalic.     Right Ear: Tympanic membrane and external ear normal.     Left Ear: Tympanic membrane and external ear normal.     Nose: Nose normal.  Eyes:     Extraocular Movements: Extraocular movements intact.  Cardiovascular:     Rate and Rhythm: Normal rate and regular rhythm.  Pulmonary:     Effort: Pulmonary effort is normal.     Breath sounds: Normal breath sounds.  Musculoskeletal:  General: Normal range of motion.     Cervical back: Normal range of motion.  Skin:    General: Skin is warm and dry.  Neurological:     Mental Status: She is oriented to person, place, and time.  Psychiatric:        Mood and Affect: Mood normal.        Behavior: Behavior normal.    ROS Comprehensive ROS Pertinent positive and negative noted in HPI    Last 3 Office BP readings: BP Readings from Last 3 Encounters:  01/26/22 122/82  07/21/21 (Abnormal) 146/86  05/08/21 (Abnormal) 157/91    BMET    Component Value Date/Time   NA 136 05/08/2021 1049   K 3.8 05/08/2021 1049   CL 94 (L) 05/08/2021 1049   CO2 26 05/08/2021 1049   GLUCOSE 83 05/08/2021 1049   BUN 11 05/08/2021 1049   CREATININE 0.91 05/08/2021 1049   CALCIUM 10.2  05/08/2021 1049   GFRNONAA 67 01/15/2020 0926   GFRAA 77 01/15/2020 0926    Renal function: CrCl cannot be calculated (Patient's most recent lab result is older than the maximum 21 days allowed.).  Clinical ASCVD: Yes  The 10-year ASCVD risk score (Arnett DK, et al., 2019) is: 26.5%   Values used to calculate the score:     Age: 47 years     Sex: Female     Is Non-Hispanic African American: Yes     Diabetic: No     Tobacco smoker: Yes     Systolic Blood Pressure: 378 mmHg     Is BP treated: Yes     HDL Cholesterol: 61 mg/dL     Total Cholesterol: 234 mg/dL  ASCVD risk factors include- [place]   ASSESSMENT & PLAN: Joann Vang was seen today for hypertension and medication refill.  Diagnoses and all orders for this visit:  Mixed hyperlipidemia  Healthy lifestyle diet of fruits vegetables fish nuts whole grains and low saturated fat . Foods high in cholesterol or liver, fatty meats,cheese, butter avocados, nuts and seeds, chocolate and fried foods. On atorvastatin 20mg  nightly -     Lipid Panel  Medication refill   chlorthalidone (HYGROTON) 25 MG tablet; Take 1 tablet (25 mg total) by mouth daily.  Medication management -     CBC with Differential       amLODipine (NORVASC) 2.5 MG tablet; Take one tablet daily -     telmisartan (MICARDIS) 20 MG tablet; Take 1 tablet (20 mg total) by mouth daily.    Essential hypertension -Counseled on lifestyle modifications for blood pressure control including reduced dietary sodium, increased exercise, weight reduction and adequate sleep. Also, educated patient about the risk for cardiovascular events, stroke and heart attack. Also counseled patient about the importance of medication adherence. If you participate in smoking, it is important to stop using tobacco as this will increase the risks associated with uncontrolled blood pressure.  Goal BP:  For patients younger than 60: Goal BP < 130/80. For patients 60 and older: Goal BP < 140/90. For  patients with diabetes: Goal BP < 130/80. Your most recent BP: 122/82  Minimize salt intake. Minimize alcohol intake    CMP14+EGFR  This note has been created with Surveyor, quantity. Any transcriptional errors are unintentional.   Kerin Perna, NP 01/26/2022, 9:43 AM

## 2022-01-26 NOTE — Telephone Encounter (Signed)
Called patient and left her know that she can call insurance to have doctor be put on. No further question or concern

## 2022-01-27 ENCOUNTER — Telehealth: Payer: Self-pay | Admitting: Emergency Medicine

## 2022-01-27 LAB — CBC WITH DIFFERENTIAL/PLATELET
Basophils Absolute: 0 10*3/uL (ref 0.0–0.2)
Basos: 1 %
EOS (ABSOLUTE): 0 10*3/uL (ref 0.0–0.4)
Eos: 1 %
Hematocrit: 42.5 % (ref 34.0–46.6)
Hemoglobin: 14.4 g/dL (ref 11.1–15.9)
Immature Grans (Abs): 0 10*3/uL (ref 0.0–0.1)
Immature Granulocytes: 0 %
Lymphocytes Absolute: 2.1 10*3/uL (ref 0.7–3.1)
Lymphs: 43 %
MCH: 29.3 pg (ref 26.6–33.0)
MCHC: 33.9 g/dL (ref 31.5–35.7)
MCV: 86 fL (ref 79–97)
Monocytes Absolute: 0.6 10*3/uL (ref 0.1–0.9)
Monocytes: 13 %
Neutrophils Absolute: 2 10*3/uL (ref 1.4–7.0)
Neutrophils: 42 %
Platelets: 210 10*3/uL (ref 150–450)
RBC: 4.92 x10E6/uL (ref 3.77–5.28)
RDW: 13.1 % (ref 11.7–15.4)
WBC: 4.8 10*3/uL (ref 3.4–10.8)

## 2022-01-27 LAB — CMP14+EGFR
ALT: 14 IU/L (ref 0–32)
AST: 23 IU/L (ref 0–40)
Albumin/Globulin Ratio: 1.2 (ref 1.2–2.2)
Albumin: 4.6 g/dL (ref 3.8–4.8)
Alkaline Phosphatase: 77 IU/L (ref 44–121)
BUN/Creatinine Ratio: 11 — ABNORMAL LOW (ref 12–28)
BUN: 10 mg/dL (ref 8–27)
Bilirubin Total: 0.5 mg/dL (ref 0.0–1.2)
CO2: 24 mmol/L (ref 20–29)
Calcium: 10.1 mg/dL (ref 8.7–10.3)
Chloride: 93 mmol/L — ABNORMAL LOW (ref 96–106)
Creatinine, Ser: 0.88 mg/dL (ref 0.57–1.00)
Globulin, Total: 3.7 g/dL (ref 1.5–4.5)
Glucose: 69 mg/dL — ABNORMAL LOW (ref 70–99)
Potassium: 3.5 mmol/L (ref 3.5–5.2)
Sodium: 135 mmol/L (ref 134–144)
Total Protein: 8.3 g/dL (ref 6.0–8.5)
eGFR: 70 mL/min/{1.73_m2} (ref >59)

## 2022-01-27 LAB — LIPID PANEL
Chol/HDL Ratio: 4 ratio (ref 0.0–4.4)
Cholesterol, Total: 245 mg/dL — ABNORMAL HIGH (ref 100–199)
HDL: 61 mg/dL (ref >39)
LDL Chol Calc (NIH): 173 mg/dL — ABNORMAL HIGH (ref 0–99)
Triglycerides: 67 mg/dL (ref 0–149)
VLDL Cholesterol Cal: 11 mg/dL (ref 5–40)

## 2022-01-27 NOTE — Telephone Encounter (Signed)
noted 

## 2022-01-27 NOTE — Telephone Encounter (Signed)
Copied from [place] [id number]. Topic: General - Inquiry >> Jan 27, 2022 10:22 AM Erskine Squibb wrote: Reason for CRM: The patient called in wanting her provider to know she has spoken to her insurance and has been authorized to continue seeing her current provider.

## 2022-01-29 MED ORDER — AMLODIPINE BESYLATE 2.5 MG PO TABS: ORAL_TABLET | ORAL | 1 refills | Status: DC

## 2022-01-29 MED ORDER — TELMISARTAN 20 MG PO TABS: 20.0000 mg | ORAL_TABLET | Freq: Every day | ORAL | 1 refills | Status: DC

## 2022-01-29 MED ORDER — CHLORTHALIDONE 25 MG PO TABS: 25.0000 mg | ORAL_TABLET | Freq: Every day | ORAL | 1 refills | Status: DC

## 2022-02-02 ENCOUNTER — Other Ambulatory Visit (INDEPENDENT_AMBULATORY_CARE_PROVIDER_SITE_OTHER): Payer: Self-pay | Admitting: Primary Care

## 2022-02-02 DIAGNOSIS — E782 Mixed hyperlipidemia: Secondary | ICD-10-CM

## 2022-02-02 MED ORDER — ATORVASTATIN CALCIUM 20 MG PO TABS: 20.0000 mg | ORAL_TABLET | Freq: Every day | ORAL | 2 refills | Status: DC

## 2022-02-05 ENCOUNTER — Encounter (INDEPENDENT_AMBULATORY_CARE_PROVIDER_SITE_OTHER): Payer: Self-pay

## 2022-03-27 DIAGNOSIS — I129 Hypertensive chronic kidney disease with stage 1 through stage 4 chronic kidney disease, or unspecified chronic kidney disease: Secondary | ICD-10-CM | POA: Diagnosis not present

## 2022-03-27 DIAGNOSIS — R69 Illness, unspecified: Secondary | ICD-10-CM | POA: Diagnosis not present

## 2022-03-27 DIAGNOSIS — Z87891 Personal history of nicotine dependence: Secondary | ICD-10-CM | POA: Diagnosis not present

## 2022-03-27 DIAGNOSIS — E785 Hyperlipidemia, unspecified: Secondary | ICD-10-CM | POA: Diagnosis not present

## 2022-03-27 DIAGNOSIS — G3184 Mild cognitive impairment, so stated: Secondary | ICD-10-CM | POA: Diagnosis not present

## 2022-03-27 DIAGNOSIS — Z008 Encounter for other general examination: Secondary | ICD-10-CM | POA: Diagnosis not present

## 2022-03-27 DIAGNOSIS — Z833 Family history of diabetes mellitus: Secondary | ICD-10-CM | POA: Diagnosis not present

## 2022-03-27 DIAGNOSIS — Z8249 Family history of ischemic heart disease and other diseases of the circulatory system: Secondary | ICD-10-CM | POA: Diagnosis not present

## 2022-03-27 DIAGNOSIS — N182 Chronic kidney disease, stage 2 (mild): Secondary | ICD-10-CM | POA: Diagnosis not present

## 2022-03-27 DIAGNOSIS — Z803 Family history of malignant neoplasm of breast: Secondary | ICD-10-CM | POA: Diagnosis not present

## 2022-05-06 ENCOUNTER — Ambulatory Visit (INDEPENDENT_AMBULATORY_CARE_PROVIDER_SITE_OTHER): Payer: Medicare HMO | Admitting: Primary Care

## 2022-05-14 ENCOUNTER — Telehealth (INDEPENDENT_AMBULATORY_CARE_PROVIDER_SITE_OTHER): Payer: Self-pay | Admitting: Primary Care

## 2022-05-14 NOTE — Telephone Encounter (Signed)
Contacted Euretha Rosinski to schedule their annual wellness visit. Appointment made for 03/29/2023.  Thank you,  Stephanie,  AMB Clinical Support [facility] Direct Dial ??[phone]   

## 2022-05-14 NOTE — Telephone Encounter (Signed)
Contacted Kishara Loveland to schedule their annual wellness visit. Appointment made for 03/29/2023.  Thank you,  Judeth Cornfield,  AMB Clinical Support [facility] Direct Dial ??[phone]

## 2022-05-22 ENCOUNTER — Encounter (INDEPENDENT_AMBULATORY_CARE_PROVIDER_SITE_OTHER): Payer: Self-pay | Admitting: Primary Care

## 2022-05-22 ENCOUNTER — Ambulatory Visit (INDEPENDENT_AMBULATORY_CARE_PROVIDER_SITE_OTHER): Payer: Medicare HMO | Admitting: Primary Care

## 2022-05-22 VITALS — BP 167/90 | HR 65 | Resp 16 | Ht 63.0 in | Wt 160.4 lb

## 2022-05-22 DIAGNOSIS — I10 Essential (primary) hypertension: Secondary | ICD-10-CM | POA: Diagnosis not present

## 2022-05-22 DIAGNOSIS — E782 Mixed hyperlipidemia: Secondary | ICD-10-CM | POA: Diagnosis not present

## 2022-05-22 DIAGNOSIS — E2839 Other primary ovarian failure: Secondary | ICD-10-CM

## 2022-05-22 NOTE — Progress Notes (Signed)
[facility]  Joann Vang, is a 72 y.o. female  OZH:[id number]  GEX:[id number]  DOB - 06-Dec-1950  Chief Complaint  Patient presents with   Hypertension       Subjective:   Joann Vang is a 72 y.o. female here today for a follow up visit. Patient has No headache, No chest pain, No abdominal pain - No Nausea, No new weakness tingling or numbness, No Cough - shortness of breath  No problems updated.  No Known Allergies  Past Medical History:  Diagnosis Date   Fatty liver    GERD (gastroesophageal reflux disease)    none recent   Headache    migraines, once a year   Hypertension     Current Outpatient Medications on File Prior to Visit  Medication Sig Dispense Refill   amLODipine (NORVASC) 2.5 MG tablet Take one tablet daily 90 tablet 1   atorvastatin (LIPITOR) 20 MG tablet Take 1 tablet (20 mg total) by mouth daily. (Patient not taking: Reported on 05/22/2022) 90 tablet 2   chlorthalidone (HYGROTON) 25 MG tablet Take 1 tablet (25 mg total) by mouth daily. 90 tablet 1   telmisartan (MICARDIS) 20 MG tablet Take 1 tablet (20 mg total) by mouth daily. 90 tablet 1   No current facility-administered medications on file prior to visit.    Objective:   Vitals:   05/22/22 1013 05/22/22 1014  BP: (Abnormal) 157/81 (Abnormal) 167/90  Pulse: 65   Resp: 16   SpO2: 98%   Weight: 160 lb 6.4 oz (72.8 kg)   Height: 5\' 3"  (1.6 m)     Comprehensive ROS Pertinent positive and negative noted in HPI   Exam General appearance : Awake, alert, not in any distress. Speech Clear. Not toxic looking HEENT: Atraumatic and Normocephalic, pupils equally reactive to light and accomodation Neck: Supple, no JVD. No cervical lymphadenopathy.  Chest: Good air entry bilaterally, no added sounds  CVS: S1 S2 regular, no murmurs.  Abdomen: Bowel sounds present, Non tender and not distended with no gaurding, rigidity or rebound. Extremities: B/L Lower Ext shows no edema, both  legs are warm to touch Neurology: Awake alert, and oriented X 3, CN II-XII intact, Non focal Skin: No Rash  Data Review No results found for: "HGBA1C"  Assessment & Plan   Marjon was seen today for hypertension.  Diagnoses and all orders for this visit:  Estrogen deficiency -     DG Bone Density; Future  White coat syndrome with diagnosis of hypertension She does take her BP readings at home range 106-125 systolic and diastolic 64-80  Continue current Bp meds  Mixed hyperlipidemia  Stopped statin made ache all over admits to unhealthy eating and she will purchase omega 3 fatty acid monitor fat intake repeat labs in 3 months.   Patient have been counseled extensively about nutrition and exercise. Other issues discussed during this visit include: low cholesterol diet, weight control and daily exercise, foot care, annual eye examinations at Ophthalmology, importance of adherence with medications and regular follow-up. We also discussed long term complications of uncontrolled diabetes and hypertension.   No follow-ups on file.  The patient was given clear instructions to go to ER or return to medical center if symptoms don't improve, worsen or new problems develop. The patient verbalized understanding. The patient was told to call to get lab results if they haven't heard anything in the next week.   This note has been created with Education officer, environmental.  Any transcriptional errors are unintentional.   Grayce Sessions, NP 05/22/2022, 10:20 AM

## 2022-05-22 NOTE — Patient Instructions (Signed)
Fish Oil, Omega-3 Fatty Acids Capsules (OTC) What is this medication? FISH OIL, OMEGA-3 FATTY ACIDS (fish oyl, oh MEH guh three FA tee A suhds) support heart, brain, and eye health. They may also decrease inflammation. Omega-3 fatty acids are essential fats the body needs to support overall health. This supplement is not intended to diagnose, treat, cure, or prevent any disease. This medicine may be used for other purposes; ask your health care provider or pharmacist if you have questions. COMMON BRAND NAME(S): Omega MonoPure EPA, Omega-3, Omega-3 Fish Oil, OmegaPure 780, OmegaPure 900, TherOmega, THEROMEGA SPORT What should I tell my care team before I take this medication? They need to know if you have any of these conditions Bleeding problems Lung or breathing disease, like asthma An unusual or allergic reaction to fish oil, omega-3 fatty acids, fish, other medications, foods, dyes, or preservatives Pregnant or trying to get pregnant Breast-feeding How should I use this medication? Take this medication by mouth with a glass of water. Follow the directions on the package or prescription label. Take with food. Take your medication at regular intervals. Do not take your medication more often than directed. Talk to your care team about the use of this medication in children. Special care may be needed. This medication should not be used in children without a care team's advice. Overdosage: If you think you have taken too much of this medicine contact a poison control center or emergency room at once. NOTE: This medicine is only for you. Do not share this medicine with others. What if I miss a dose? If you miss a dose, take it as soon as you can. If it is almost time for your next dose, take only that dose. Do not take double or extra doses. What may interact with this medication? Aspirin and aspirin-like medications Herbal products like danshen, dong quai, garlic pills, ginger, ginkgo biloba,  horse chestnut, willow bark, and others Medications that treat or prevent blood clots, such as enoxaparin, heparin, warfarin This list may not describe all possible interactions. Give your health care provider a list of all the medicines, herbs, non-prescription drugs, or dietary supplements you use. Also tell them if you smoke, drink alcohol, or use illegal drugs. Some items may interact with your medicine. What should I watch for while using this medication? Follow a good diet and exercise plan. Taking a dietary supplement does not replace a healthy lifestyle. Some foods that have omega-3 fatty acids naturally are fatty fish like albacore tuna, halibut, herring, mackerel, lake trout, salmon, and sardines. Too much of this medication can be unsafe. Talk to your care team about how much of this medication is right for you. If you are scheduled for any medical or dental procedure, tell your care team that you are taking this medication. You may need to stop taking this medication before the procedure. Herbal or dietary supplements are not regulated like medications. Rigid quality control standards are not required for dietary supplements. The purity and strength of these products can vary. The safety and effect of this dietary supplement for a certain disease or illness is not well known. This product is not intended to diagnose, treat, cure or prevent any disease. The Food and Drug Administration suggests the following to help consumers protect themselves: Always read product labels and follow directions. Natural does not mean a product is safe for humans to take. Look for products that include USP after the ingredient name. This means that the manufacturer followed the standards  of the [place] Pharmacopoeia. Products made or sold by a nationally known food or drug company are more likely to be made under tight controls. You can write to the company for more information about how the product was made. What  side effects may I notice from receiving this medication? Side effects that you should report to your care team as soon as possible: Allergic reactions--skin rash, itching, hives, swelling of the face, lips, tongue, or throat Side effects that usually do not require medical attention (report to your care team if they continue or are bothersome): Bad breath Burping Fishy aftertaste Heartburn Upset stomach This list may not describe all possible side effects. Call your doctor for medical advice about side effects. You may report side effects to FDA at 1-800-FDA-1088. Where should I keep my medication? Keep out of the reach of children. Store at room temperature or as directed on the package label. Protect from moisture. Do not freeze. Throw away any unused medication after the expiration date. NOTE: This sheet is a summary. It may not cover all possible information. If you have questions about this medicine, talk to your doctor, pharmacist, or health care provider.  2023 Elsevier/Gold Standard (2020-03-31 00:00:00)

## 2022-06-19 ENCOUNTER — Other Ambulatory Visit: Payer: Self-pay | Admitting: Primary Care

## 2022-06-19 DIAGNOSIS — Z1231 Encounter for screening mammogram for malignant neoplasm of breast: Secondary | ICD-10-CM

## 2022-06-23 DIAGNOSIS — H2513 Age-related nuclear cataract, bilateral: Secondary | ICD-10-CM | POA: Diagnosis not present

## 2022-06-23 DIAGNOSIS — H524 Presbyopia: Secondary | ICD-10-CM | POA: Diagnosis not present

## 2022-06-23 DIAGNOSIS — H43393 Other vitreous opacities, bilateral: Secondary | ICD-10-CM | POA: Diagnosis not present

## 2022-07-13 ENCOUNTER — Other Ambulatory Visit (INDEPENDENT_AMBULATORY_CARE_PROVIDER_SITE_OTHER): Payer: Self-pay | Admitting: Primary Care

## 2022-07-13 DIAGNOSIS — I10 Essential (primary) hypertension: Secondary | ICD-10-CM

## 2022-07-13 NOTE — Telephone Encounter (Signed)
Requested Prescriptions  Pending Prescriptions Disp Refills   telmisartan (MICARDIS) 20 MG tablet [Pharmacy Med Name: telmisartan 20 mg tablet] 90 tablet 0    Sig: TAKE ONE TABLET BY MOUTH ONCE DAILY     Cardiovascular:  Angiotensin Receptor Blockers Failed - 07/13/2022  8:04 AM      Failed - Last BP in normal range    BP Readings from Last 1 Encounters:  05/22/22 (!) 167/90         Passed - Cr in normal range and within 180 days    Creatinine, Ser  Date Value Ref Range Status  01/26/2022 0.88 0.57 - 1.00 mg/dL Final         Passed - K in normal range and within 180 days    Potassium  Date Value Ref Range Status  01/26/2022 3.5 3.5 - 5.2 mmol/L Final         Passed - Patient is not pregnant      Passed - Valid encounter within last 6 months    Recent Outpatient Visits           1 month ago Estrogen deficiency   [place] [facility] Grayce Sessions, NP   5 months ago Essential hypertension   [place] [facility] Grayce Sessions, NP   11 months ago Essential hypertension   [place] [facility] Grayce Sessions, NP   1 year ago Hypokalemia   [place] [facility] Grayce Sessions, NP   1 year ago Medication refill   [place] [facility] Grayce Sessions, NP       Future Appointments             In 3 weeks Randa Evens, Kinnie Scales, NP [place] [facility]   In 1 month Edwards, Kinnie Scales, NP [place] [facility]             chlorthalidone (HYGROTON) 25 MG tablet [Pharmacy Med Name: chlorthalidone 25 mg tablet] 90 tablet 0    Sig: TAKE ONE TABLET BY MOUTH ONCE DAILY     Cardiovascular: Diuretics - Thiazide Failed - 07/13/2022  8:04 AM      Failed - Last BP in normal range    BP Readings from Last 1 Encounters:  05/22/22 (!) 167/90         Passed - Cr in normal range and within 180 days    Creatinine, Ser  Date Value  Ref Range Status  01/26/2022 0.88 0.57 - 1.00 mg/dL Final         Passed - K in normal range and within 180 days    Potassium  Date Value Ref Range Status  01/26/2022 3.5 3.5 - 5.2 mmol/L Final         Passed - Na in normal range and within 180 days    Sodium  Date Value Ref Range Status  01/26/2022 135 134 - 144 mmol/L Final         Passed - Valid encounter within last 6 months    Recent Outpatient Visits           1 month ago Estrogen deficiency   [place] [facility] Grayce Sessions, NP   5 months ago Essential hypertension   [place] [facility] Grayce Sessions, NP   11 months ago Essential hypertension   [place] [facility] Grayce Sessions, NP   1 year ago  Hypokalemia   [place] [facility] Grayce Sessions, NP   1 year ago Medication refill   [place] [facility] Grayce Sessions, NP       Future Appointments             In 3 weeks Randa Evens, Kinnie Scales, NP [place] [facility]   In 1 month Randa Evens, Kinnie Scales, NP [employer] [facility]             amLODipine (NORVASC) 2.5 MG tablet [Pharmacy Med Name: amlodipine 2.5 mg tablet] 90 tablet 0    Sig: TAKE ONE TABLET BY MOUTH ONCE DAILY     Cardiovascular: Calcium Channel Blockers 2 Failed - 07/13/2022  8:04 AM      Failed - Last BP in normal range    BP Readings from Last 1 Encounters:  05/22/22 (!) 167/90         Passed - Last Heart Rate in normal range    Pulse Readings from Last 1 Encounters:  05/22/22 65         Passed - Valid encounter within last 6 months    Recent Outpatient Visits           1 month ago Estrogen deficiency   [place] [facility] Grayce Sessions, NP   5 months ago Essential hypertension   [place] [facility] Grayce Sessions, NP   11 months ago Essential hypertension   [place]  [facility] Sessions, NP   1 year ago Hypokalemia   [place] [facility] Grayce Sessions, NP   1 year ago Medication refill   [place] [facility] Grayce Sessions, NP       Future Appointments             In 3 weeks Randa Evens Kinnie Scales, NP [place] [facility]   In 1 month Randa Evens, Kinnie Scales, NP  [facility]

## 2022-08-03 ENCOUNTER — Ambulatory Visit: Payer: Managed Care, Other (non HMO)

## 2022-08-04 ENCOUNTER — Ambulatory Visit (INDEPENDENT_AMBULATORY_CARE_PROVIDER_SITE_OTHER): Payer: Medicare HMO | Admitting: Primary Care

## 2022-08-14 ENCOUNTER — Ambulatory Visit: Admission: RE | Admit: 2022-08-14 | Payer: Medicare HMO | Source: Ambulatory Visit

## 2022-08-14 DIAGNOSIS — Z1231 Encounter for screening mammogram for malignant neoplasm of breast: Secondary | ICD-10-CM | POA: Diagnosis not present

## 2022-08-24 ENCOUNTER — Encounter (INDEPENDENT_AMBULATORY_CARE_PROVIDER_SITE_OTHER): Payer: Self-pay | Admitting: Primary Care

## 2022-08-24 ENCOUNTER — Ambulatory Visit (INDEPENDENT_AMBULATORY_CARE_PROVIDER_SITE_OTHER): Payer: Medicare HMO | Admitting: Primary Care

## 2022-08-24 VITALS — BP 158/83 | HR 73 | Temp 97.7°F | Resp 16 | Wt 159.2 lb

## 2022-08-24 DIAGNOSIS — E782 Mixed hyperlipidemia: Secondary | ICD-10-CM | POA: Diagnosis not present

## 2022-08-24 DIAGNOSIS — I10 Essential (primary) hypertension: Secondary | ICD-10-CM

## 2022-08-24 NOTE — Progress Notes (Signed)
  [facility]  Telephone Note  I connected with Joann Vang, on 08/24/2022 at telephone and verified that I am speaking with the correct person using two identifiers.   Consent: I discussed the limitations, risks, security and privacy concerns of performing an evaluation and management service by telephone and the availability of in person appointments. I also discussed with the patient that there may be a patient responsible charge related to this service. The patient expressed understanding and agreed to proceed.   Location of Patient: Home   Location of Provider: [facility]   Persons participating in Telemedicine visit: Higinio Plan,  NP   History of Present Illness: Joann Vang is a 72 year old female arrived for HTN. Patient was and cold on top of hungry .Patient has No headache, No chest pain, No abdominal pain - No Nausea, No new weakness tingling or numbness, No Cough - shortness of breath    Past Medical History:  Diagnosis Date   Fatty liver    GERD (gastroesophageal reflux disease)    none recent   Headache    migraines, once a year   Hypertension    No Known Allergies  Current Outpatient Medications on File Prior to Visit  Medication Sig Dispense Refill   amLODipine (NORVASC) 2.5 MG tablet TAKE ONE TABLET BY MOUTH ONCE DAILY 90 tablet 0   atorvastatin (LIPITOR) 20 MG tablet Take 1 tablet (20 mg total) by mouth daily. (Patient not taking: Reported on 05/22/2022) 90 tablet 2   chlorthalidone (HYGROTON) 25 MG tablet TAKE ONE TABLET BY MOUTH ONCE DAILY 90 tablet 0   telmisartan (MICARDIS) 20 MG tablet TAKE ONE TABLET BY MOUTH ONCE DAILY 90 tablet 0   No current facility-administered medications on file prior to visit.    Observations/Objective: Bp at home systolic 112-130 diastolic 70-84   Assessment and Plan: Joann Vang was seen today for hypertension.  Diagnoses and all  orders for this visit:  Mixed hyperlipidemia  Healthy lifestyle diet of fruits vegetables fish nuts whole grains and low saturated fat . Foods high in cholesterol or liver, fatty meats,cheese, butter avocados, nuts and seeds, chocolate and fried foods.  -     Lipid panel  Essential hypertension We have discussed target BP range and blood pressure goal. We discussed the importance of compliance with medical therapy and DASH diet recommended, consequences of uncontrolled hypertension discussed.  - continue current BP medications  -     CBC with Differential/Platelet -     [id number]+EGFR    Follow Up Instructions:    I discussed the assessment and treatment plan with the patient. The patient was provided an opportunity to ask questions and all were answered. The patient agreed with the plan and demonstrated an understanding of the instructions.   The patient was advised to call back or seek an in-person evaluation if the symptoms worsen or if the condition fails to improve as anticipated.     I provided 15 minutes total of non-face-to-face time during this encounter including median intraservice time, reviewing previous notes, investigations, ordering medications, medical decision making, coordinating care and patient verbalized understanding at the end of the visit.    This note has been created with Education officer, environmental. Any transcriptional errors are unintentional.   Grayce Sessions, NP 08/24/2022, 3:35 PM

## 2022-08-25 LAB — LIPID PANEL
Chol/HDL Ratio: 3.8 ratio (ref 0.0–4.4)
Cholesterol, Total: 215 mg/dL — ABNORMAL HIGH (ref 100–199)
HDL: 56 mg/dL (ref >39)
LDL Chol Calc (NIH): 145 mg/dL — ABNORMAL HIGH (ref 0–99)
Triglycerides: 79 mg/dL (ref 0–149)
VLDL Cholesterol Cal: 14 mg/dL (ref 5–40)

## 2022-08-25 LAB — CBC WITH DIFFERENTIAL/PLATELET
Basophils Absolute: 0 10*3/uL (ref 0.0–0.2)
Basos: 0 %
EOS (ABSOLUTE): 0.1 10*3/uL (ref 0.0–0.4)
Eos: 1 %
Hematocrit: 42.7 % (ref 34.0–46.6)
Hemoglobin: 13.7 g/dL (ref 11.1–15.9)
Immature Grans (Abs): 0 10*3/uL (ref 0.0–0.1)
Immature Granulocytes: 0 %
Lymphocytes Absolute: 2.5 10*3/uL (ref 0.7–3.1)
Lymphs: 50 %
MCH: 28.5 pg (ref 26.6–33.0)
MCHC: 32.1 g/dL (ref 31.5–35.7)
MCV: 89 fL (ref 79–97)
Monocytes Absolute: 0.6 10*3/uL (ref 0.1–0.9)
Monocytes: 12 %
Neutrophils Absolute: 1.8 10*3/uL (ref 1.4–7.0)
Neutrophils: 37 %
Platelets: 199 10*3/uL (ref 150–450)
RBC: 4.8 x10E6/uL (ref 3.77–5.28)
RDW: 12.5 % (ref 11.7–15.4)
WBC: 4.9 10*3/uL (ref 3.4–10.8)

## 2022-08-25 LAB — CMP14+EGFR
ALT: 18 IU/L (ref 0–32)
AST: 24 IU/L (ref 0–40)
Albumin: 4.5 g/dL (ref 3.8–4.8)
Alkaline Phosphatase: 107 IU/L (ref 44–121)
BUN/Creatinine Ratio: 14 (ref 12–28)
BUN: 12 mg/dL (ref 8–27)
Bilirubin Total: 0.5 mg/dL (ref 0.0–1.2)
CO2: 27 mmol/L (ref 20–29)
Calcium: 10.1 mg/dL (ref 8.7–10.3)
Chloride: 97 mmol/L (ref 96–106)
Creatinine, Ser: 0.88 mg/dL (ref 0.57–1.00)
Globulin, Total: 3.7 g/dL (ref 1.5–4.5)
Glucose: 92 mg/dL (ref 70–99)
Potassium: 3.6 mmol/L (ref 3.5–5.2)
Sodium: 139 mmol/L (ref 134–144)
Total Protein: 8.2 g/dL (ref 6.0–8.5)
eGFR: 70 mL/min/{1.73_m2} (ref >59)

## 2022-08-26 ENCOUNTER — Other Ambulatory Visit (INDEPENDENT_AMBULATORY_CARE_PROVIDER_SITE_OTHER): Payer: Self-pay | Admitting: Primary Care

## 2022-08-26 DIAGNOSIS — E782 Mixed hyperlipidemia: Secondary | ICD-10-CM

## 2022-08-26 MED ORDER — ATORVASTATIN CALCIUM 20 MG PO TABS: 20.0000 mg | ORAL_TABLET | Freq: Every day | ORAL | 2 refills | Status: DC

## 2022-10-13 IMAGING — MG MM DIGITAL SCREENING BILAT W/ TOMO AND CAD
8 series · 9 of 24 positions shown · non-contrast
Comparison: Previous exam(s).

CLINICAL DATA: Screening.

EXAM:
DIGITAL SCREENING BILATERAL MAMMOGRAM WITH TOMOSYNTHESIS AND CAD
TECHNIQUE: Bilateral screening digital craniocaudal and mediolateral oblique
mammograms were obtained. Bilateral screening digital breast
tomosynthesis was performed. The images were evaluated with
computer-aided detection.

[R CC synth-2D]
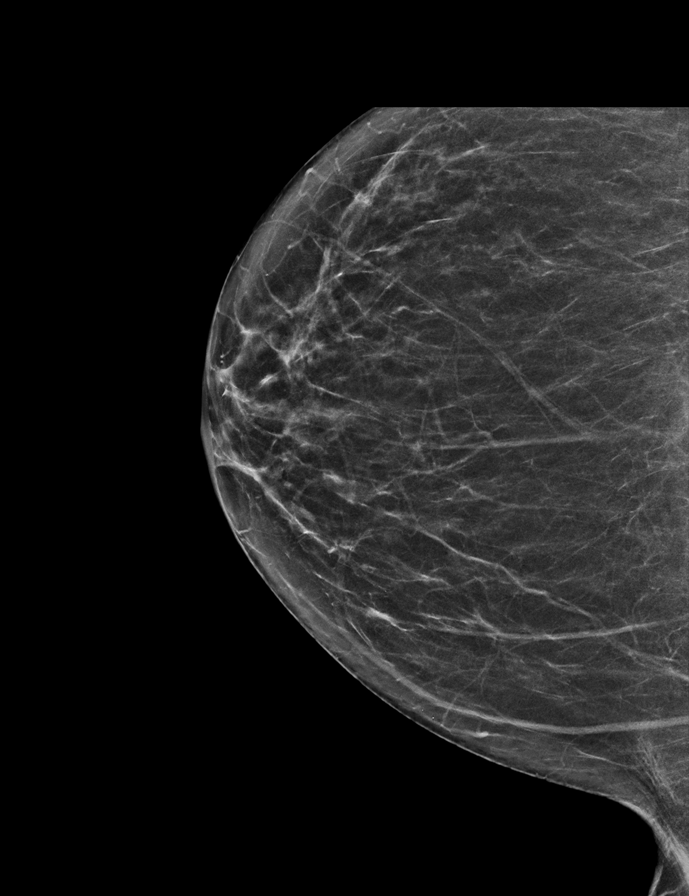

[R MLO synth-2D]
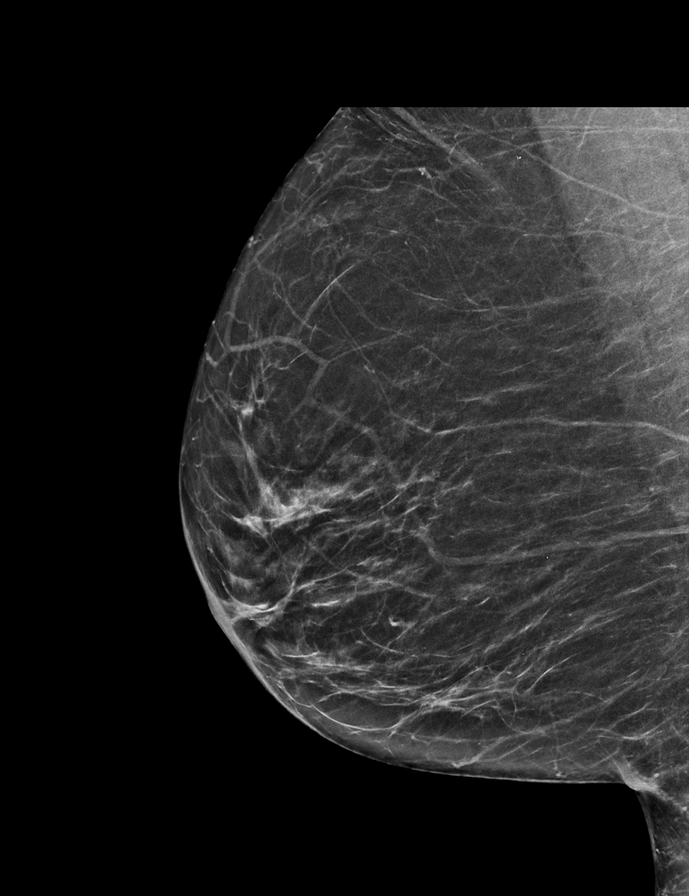

[L CC synth-2D]
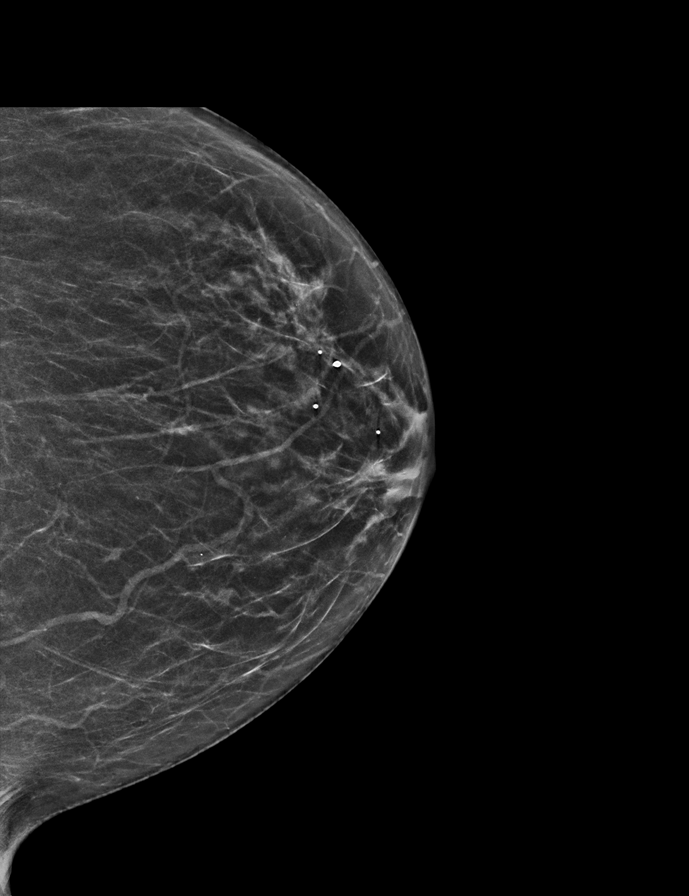

[L MLO synth-2D]
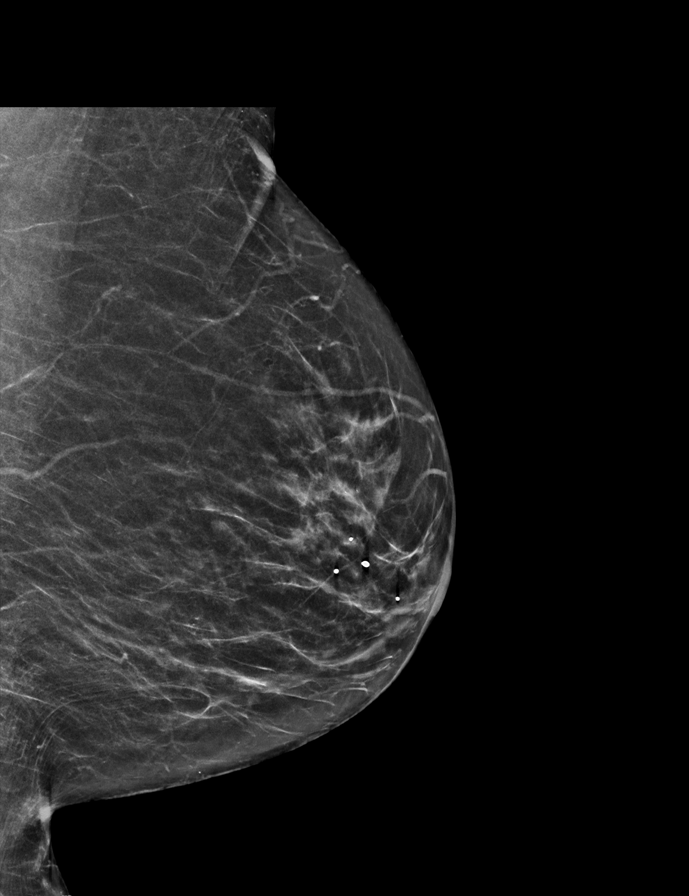

[R MLO tomo · 2 of 55 frames shown]
[frame 18/55]
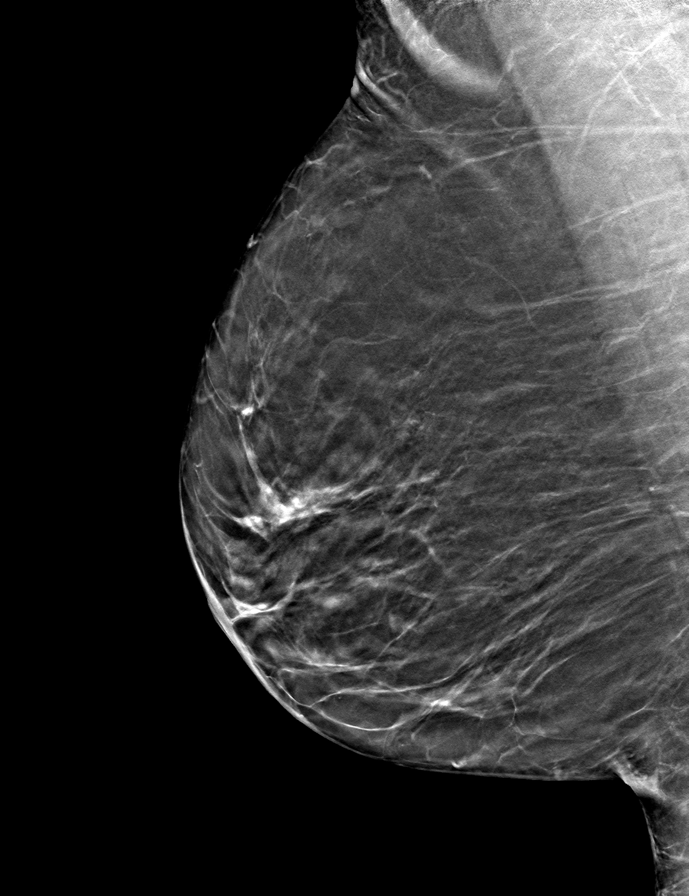
[frame 28/55]
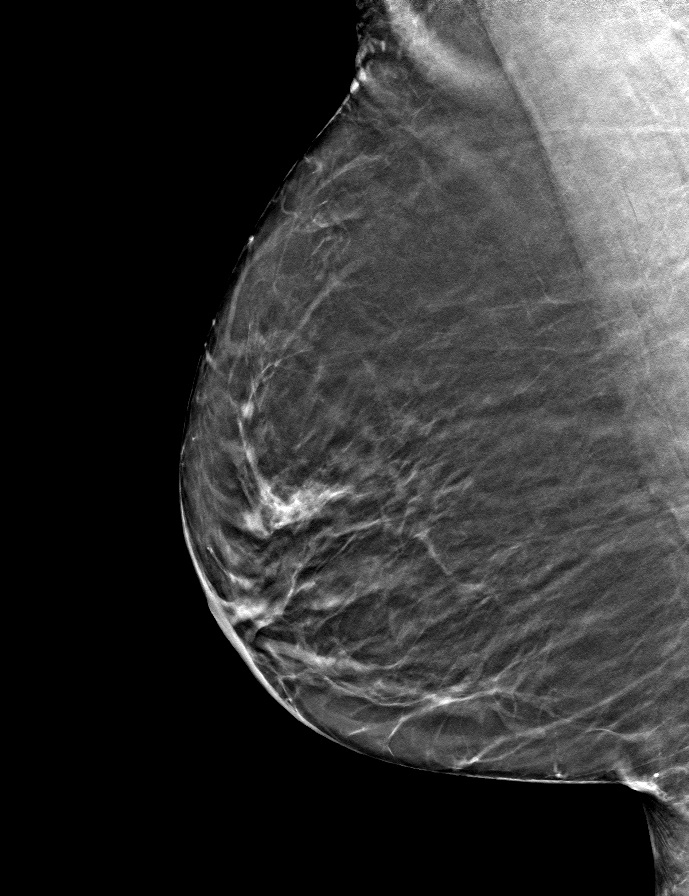

[L CC tomo · tomo slice 25/48.0]
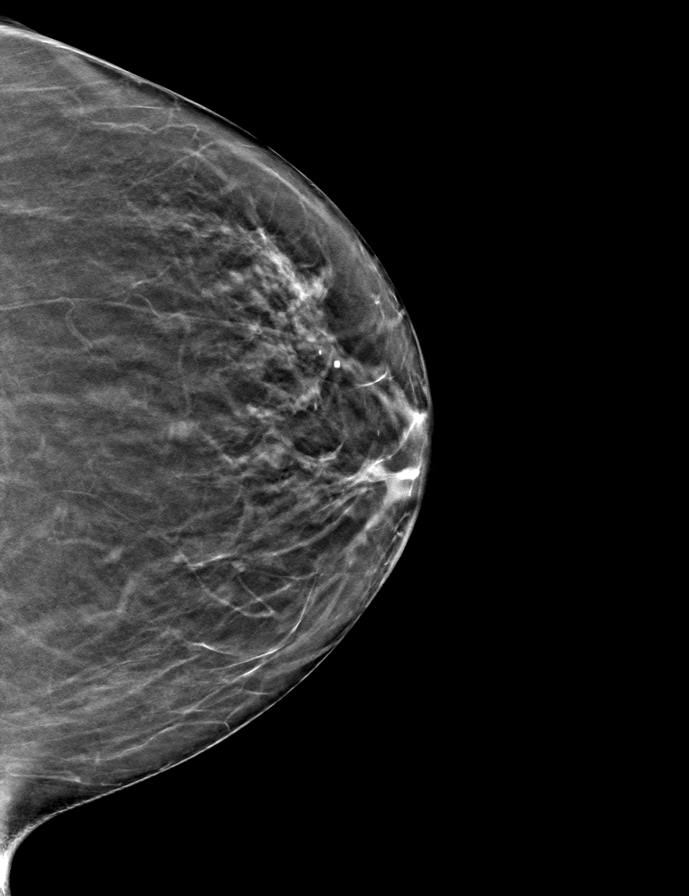

[L MLO tomo · tomo slice 27/54.0]
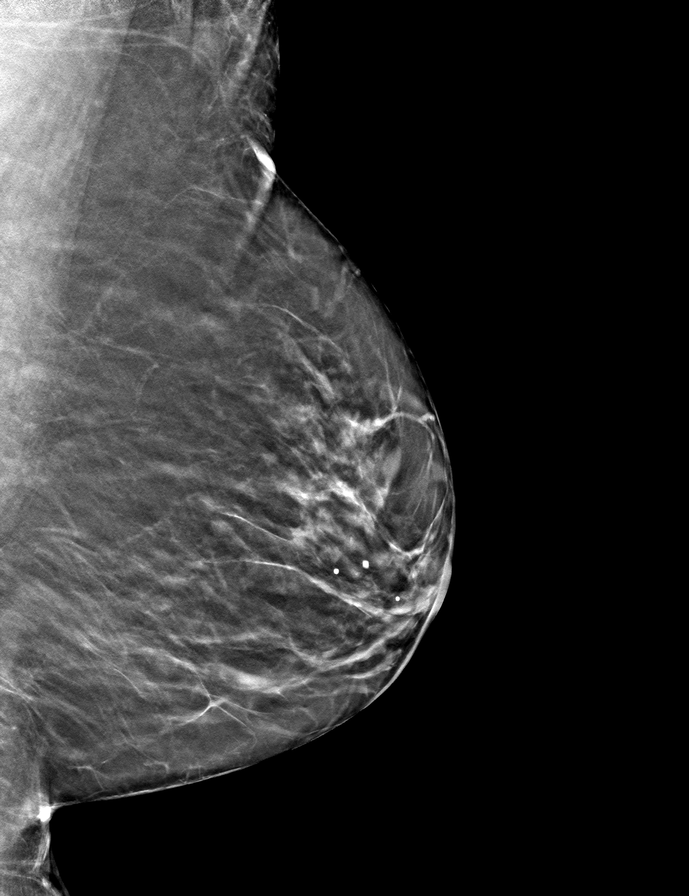

[R CC tomo · tomo slice 25/50.0]
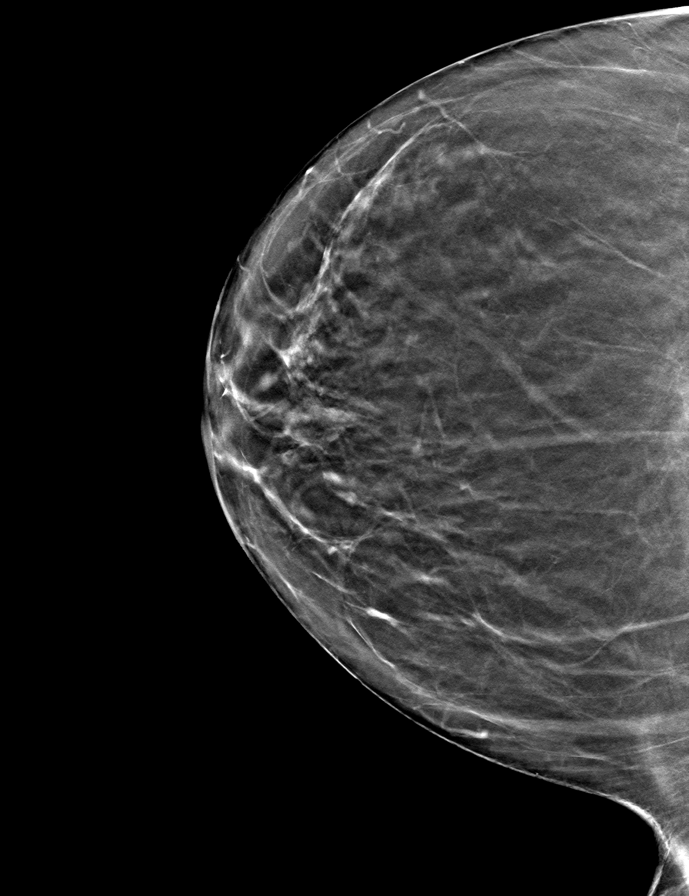

[9 of 24 positions shown; findings below may reference images not displayed]

ACR Breast Density Category b: There are scattered areas of
fibroglandular density.
FINDINGS: There are no findings suspicious for malignancy.
IMPRESSION: No mammographic evidence of malignancy. A result letter of this
screening mammogram will be mailed directly to the patient.

RECOMMENDATION:
Screening mammogram in one year. (Code:51-O-LD2)

BI-RADS CATEGORY  1: Negative.

## 2022-12-23 ENCOUNTER — Other Ambulatory Visit (INDEPENDENT_AMBULATORY_CARE_PROVIDER_SITE_OTHER): Payer: Self-pay | Admitting: Primary Care

## 2022-12-23 DIAGNOSIS — E782 Mixed hyperlipidemia: Secondary | ICD-10-CM

## 2022-12-23 DIAGNOSIS — I10 Essential (primary) hypertension: Secondary | ICD-10-CM

## 2022-12-23 MED ORDER — CHLORTHALIDONE 25 MG PO TABS: 25.0000 mg | ORAL_TABLET | Freq: Every day | ORAL | 0 refills | Status: DC

## 2022-12-23 MED ORDER — ATORVASTATIN CALCIUM 20 MG PO TABS: 20.0000 mg | ORAL_TABLET | Freq: Every day | ORAL | 2 refills | Status: DC

## 2022-12-23 MED ORDER — AMLODIPINE BESYLATE 2.5 MG PO TABS: ORAL_TABLET | ORAL | 0 refills | Status: DC

## 2022-12-23 MED ORDER — TELMISARTAN 20 MG PO TABS: 20.0000 mg | ORAL_TABLET | Freq: Every day | ORAL | 0 refills | Status: DC

## 2022-12-23 NOTE — Telephone Encounter (Signed)
Medication Refill -  Most Recent Primary Care Visit:  Provider: Grayce Sessions  Department: [facility]  Visit Type: OFFICE VISIT  Date: 08/24/2022  Medication:  telmisartan (MICARDIS) 20 MG tablet [[id number]  chlorthalidone (HYGROTON) 25 MG tablet [[id number]]     amLODipine (NORVASC) 2.5 MG tablet [[id number]]    Order D  atorvastatin (LIPITOR) 20 MG tablet [[id number]]  Has the patient contacted their pharmacy? Yes  Is this the correct pharmacy for this prescription? Yes If no, delete pharmacy and type the correct one.  This is the patient's preferred pharmacy:   [facility] [phone] - [place], [place] - [address] AT [facility] [address] [place] [address] Phone: [phone] Fax: [phone]   Has the prescription been filled recently? Yes  Is the patient out of the medication? No  Has the patient been seen for an appointment in the last year OR does the patient have an upcoming appointment? Yes  Can we respond through MyChart? Yes  Agent: Please be advised that Rx refills may take up to 3 business days. We ask that you follow-up with your pharmacy.

## 2022-12-23 NOTE — Telephone Encounter (Signed)
Requested Prescriptions  Pending Prescriptions Disp Refills   telmisartan (MICARDIS) 20 MG tablet 90 tablet 0    Sig: Take 1 tablet (20 mg total) by mouth daily.     Cardiovascular:  Angiotensin Receptor Blockers Failed - 12/23/2022  4:58 PM      Failed - Last BP in normal range    BP Readings from Last 1 Encounters:  08/24/22 (!) 158/83         Passed - Cr in normal range and within 180 days    Creatinine, Ser  Date Value Ref Range Status  08/24/2022 0.88 0.57 - 1.00 mg/dL Final         Passed - K in normal range and within 180 days    Potassium  Date Value Ref Range Status  08/24/2022 3.6 3.5 - 5.2 mmol/L Final         Passed - Patient is not pregnant      Passed - Valid encounter within last 6 months    Recent Outpatient Visits           4 months ago Mixed hyperlipidemia   [place] [facility] Grayce Sessions, NP   7 months ago Estrogen deficiency   [place] [facility] Grayce Sessions, NP   11 months ago Essential hypertension   [place] [facility] Grayce Sessions, NP   1 year ago Essential hypertension   [place] [facility] Grayce Sessions, NP   1 year ago Hypokalemia   [place] [facility] Grayce Sessions, NP       Future Appointments             In 1 week Grayce Sessions, NP [place] [facility]             chlorthalidone (HYGROTON) 25 MG tablet 90 tablet 0    Sig: Take 1 tablet (25 mg total) by mouth daily.     Cardiovascular: Diuretics - Thiazide Failed - 12/23/2022  4:58 PM      Failed - Last BP in normal range    BP Readings from Last 1 Encounters:  08/24/22 (!) 158/83         Passed - Cr in normal range and within 180 days    Creatinine, Ser  Date Value Ref Range Status  08/24/2022 0.88 0.57 - 1.00 mg/dL Final         Passed - K in normal range and within 180 days    Potassium  Date Value Ref  Range Status  08/24/2022 3.6 3.5 - 5.2 mmol/L Final         Passed - Na in normal range and within 180 days    Sodium  Date Value Ref Range Status  08/24/2022 139 134 - 144 mmol/L Final         Passed - Valid encounter within last 6 months    Recent Outpatient Visits           4 months ago Mixed hyperlipidemia   [place] [facility] Grayce Sessions, NP   7 months ago Estrogen deficiency   [place] [facility] Grayce Sessions, NP   11 months ago Essential hypertension   [place] [facility] Grayce Sessions, NP   1 year ago Essential hypertension   [place] [facility] Grayce Sessions, NP   1 year ago Hypokalemia   [place] [facility] Grayce Sessions, NP       Future Appointments             In 1 week Grayce Sessions, NP [place] [facility]             amLODipine (NORVASC) 2.5 MG tablet 90 tablet 0    Sig: TAKE ONE TABLET BY MOUTH ONCE DAILY     Cardiovascular: Calcium Channel Blockers 2 Failed - 12/23/2022  4:58 PM      Failed - Last BP in normal range    BP Readings from Last 1 Encounters:  08/24/22 (!) 158/83         Passed - Last Heart Rate in normal range    Pulse Readings from Last 1 Encounters:  08/24/22 73         Passed - Valid encounter within last 6 months    Recent Outpatient Visits           4 months ago Mixed hyperlipidemia   [place] [facility] Grayce Sessions, NP   7 months ago Estrogen deficiency   [place] [facility] Grayce Sessions, NP   11 months ago Essential hypertension   [place] [facility] Grayce Sessions, NP   1 year ago Essential hypertension   [place] [facility] Grayce Sessions, NP   1 year ago Hypokalemia   [place] [facility] Grayce Sessions, NP       Future  Appointments             In 1 week Grayce Sessions, NP [place] [facility]             atorvastatin (LIPITOR) 20 MG tablet 90 tablet 2    Sig: Take 1 tablet (20 mg total) by mouth daily.     Cardiovascular:  Antilipid - Statins Failed - 12/23/2022  4:58 PM      Failed - Lipid Panel in normal range within the last 12 months    Cholesterol, Total  Date Value Ref Range Status  08/24/2022 215 (H) 100 - 199 mg/dL Final   LDL Chol Calc (NIH)  Date Value Ref Range Status  08/24/2022 145 (H) 0 - 99 mg/dL Final   HDL  Date Value Ref Range Status  08/24/2022 56 >39 mg/dL Final   Triglycerides  Date Value Ref Range Status  08/24/2022 79 0 - 149 mg/dL Final         Passed - Patient is not pregnant      Passed - Valid encounter within last 12 months    Recent Outpatient Visits           4 months ago Mixed hyperlipidemia   [place] [facility] Grayce Sessions, NP   7 months ago Estrogen deficiency   [place] [facility] Grayce Sessions, NP   11 months ago Essential hypertension   [place] [facility] Grayce Sessions, NP   1 year ago Essential hypertension   [place] [facility] Grayce Sessions, NP   1 year ago Hypokalemia   [place] [facility] Grayce Sessions, NP       Future Appointments             In 1 week Randa Evens, Kinnie Scales, NP [place] [facility]

## 2022-12-23 NOTE — Telephone Encounter (Signed)
Patient called and advised she was supposed to see Marcelino Duster last month, asked if she could be scheduled, she agrees. Appointment scheduled for 12/31/22.She says she has 1 week of medications left, advised I will send in the refill.

## 2022-12-31 ENCOUNTER — Ambulatory Visit (INDEPENDENT_AMBULATORY_CARE_PROVIDER_SITE_OTHER): Payer: Medicare HMO | Admitting: Primary Care

## 2022-12-31 ENCOUNTER — Encounter (INDEPENDENT_AMBULATORY_CARE_PROVIDER_SITE_OTHER): Payer: Self-pay | Admitting: Primary Care

## 2022-12-31 ENCOUNTER — Ambulatory Visit
Admission: RE | Admit: 2022-12-31 | Discharge: 2022-12-31 | Disposition: A | Discharge status: Home / Self Care | Payer: Medicare HMO | Source: Ambulatory Visit | Attending: Primary Care | Admitting: Primary Care

## 2022-12-31 VITALS — BP 143/84 | HR 71 | Resp 16 | Wt 161.2 lb

## 2022-12-31 DIAGNOSIS — E782 Mixed hyperlipidemia: Secondary | ICD-10-CM

## 2022-12-31 DIAGNOSIS — N958 Other specified menopausal and perimenopausal disorders: Secondary | ICD-10-CM | POA: Diagnosis not present

## 2022-12-31 DIAGNOSIS — M8588 Other specified disorders of bone density and structure, other site: Secondary | ICD-10-CM | POA: Diagnosis not present

## 2022-12-31 DIAGNOSIS — E876 Hypokalemia: Secondary | ICD-10-CM

## 2022-12-31 DIAGNOSIS — I10 Essential (primary) hypertension: Secondary | ICD-10-CM

## 2022-12-31 DIAGNOSIS — Z2821 Immunization not carried out because of patient refusal: Secondary | ICD-10-CM | POA: Diagnosis not present

## 2022-12-31 DIAGNOSIS — E2839 Other primary ovarian failure: Secondary | ICD-10-CM | POA: Diagnosis not present

## 2022-12-31 NOTE — Progress Notes (Signed)
[facility]  Joann Vang, is a 72 y.o. female  WUJ:[id number]  [id number]:[id number]  DOB - Aug 19, 1950  Chief Complaint  Patient presents with   Hypertension       Subjective:   Joann Vang is a 72 y.o. female here today for a follow up visit for HTN.  Patient has No headache, No chest pain, No abdominal pain - No Nausea, No new weakness tingling or numbness, No Cough - shortness of breath   No problems updated.  Comprehensive ROS Pertinent positive and negative noted in HPI   No Known Allergies  Past Medical History:  Diagnosis Date   Fatty liver    GERD (gastroesophageal reflux disease)    none recent   Headache    migraines, once a year   Hypertension     Current Outpatient Medications on File Prior to Visit  Medication Sig Dispense Refill   amLODipine (NORVASC) 2.5 MG tablet TAKE ONE TABLET BY MOUTH ONCE DAILY 90 tablet 0   atorvastatin (LIPITOR) 20 MG tablet Take 1 tablet (20 mg total) by mouth daily. 90 tablet 2   chlorthalidone (HYGROTON) 25 MG tablet Take 1 tablet (25 mg total) by mouth daily. 90 tablet 0   telmisartan (MICARDIS) 20 MG tablet Take 1 tablet (20 mg total) by mouth daily. 90 tablet 0   No current facility-administered medications on file prior to visit.   Health Maintenance  Topic Date Due   COVID-19 Vaccine (1) Never done   Medicare Annual Wellness Visit  03/26/2018   Pneumonia Vaccine (1 of 2 - PCV) 01/27/2023*   Zoster (Shingles) Vaccine (1 of 2) 03/31/2023*   Flu Shot  04/05/2023*   Mammogram  08/13/2024   Colon Cancer Screening  07/19/2029   DEXA scan (bone density measurement)  Completed   Hepatitis C Screening  Completed   HPV Vaccine  Aged Out   DTaP/Tdap/Td vaccine  Discontinued  *Topic was postponed. The date shown is not the original due date.    Objective:   Vitals:   12/31/22 1450 12/31/22 1451  BP: (!) 158/94 (!) 143/84  Pulse: 71   Resp: 16   SpO2: 97%   Weight: 161 lb 3.2 oz (73.1 kg)    BP  Readings from Last 3 Encounters:  12/31/22 (!) 143/84  08/24/22 (!) 158/83  05/22/22 (!) 167/90    Physical Exam Vitals reviewed.  HENT:     Head: Normocephalic.     Right Ear: Tympanic membrane, ear canal and external ear normal.     Left Ear: Tympanic membrane, ear canal and external ear normal.     Nose: Nose normal.     Mouth/Throat:     Mouth: Mucous membranes are moist.  Eyes:     Extraocular Movements: Extraocular movements intact.     Pupils: Pupils are equal, round, and reactive to light.  Cardiovascular:     Rate and Rhythm: Normal rate.  Pulmonary:     Effort: Pulmonary effort is normal.     Breath sounds: Normal breath sounds.  Abdominal:     General: Bowel sounds are normal.     Palpations: Abdomen is soft.  Musculoskeletal:        General: Normal range of motion.     Cervical back: Normal range of motion.  Skin:    General: Skin is warm and dry.  Neurological:     Mental Status: She is alert and oriented to person, place, and time.  Psychiatric:  Mood and Affect: Mood normal.        Behavior: Behavior normal.        Thought Content: Thought content normal.      Assessment & Plan  Chaille was seen today for hypertension.  Diagnoses and all orders for this visit:  Influenza vaccination declined  Herpes zoster vaccination declined  Mixed hyperlipidemia -     Lipid panel  Essential hypertension BP goal - < 140/90 Explained that having normal blood pressure is the goal and medications are helping to get to goal and maintain normal blood pressure. DIET: Limit salt intake, read nutrition labels to check salt content, limit fried and high fatty foods  Avoid using multisymptom OTC cold preparations that generally contain sudafed which can rise BP. Consult with pharmacist on best cold relief products to use for persons with HTN EXERCISE Discussed incorporating exercise such as walking - 30 minutes most days of the week and can do in 10 minute  intervals    -     Comprehensive metabolic panel  Hypokalemia Not taking [facility] hospital  -     Comprehensive metabolic panel      Patient have been counseled extensively about nutrition and exercise. Other issues discussed during this visit include: low cholesterol diet, weight control and daily exercise, foot care, annual eye examinations at Ophthalmology, importance of adherence with medications and regular follow-up. We also discussed long term complications of uncontrolled diabetes and hypertension.   Return in about 6 months (around 07/01/2023) for medical conditions.  The patient was given clear instructions to go to ER or return to medical center if symptoms don't improve, worsen or new problems develop. The patient verbalized understanding. The patient was told to call to get lab results if they haven't heard anything in the next week.   This note has been created with Education officer, environmental. Any transcriptional errors are unintentional.   Grayce Sessions, NP 12/31/2022, 3:21 PM

## 2023-01-01 LAB — COMPREHENSIVE METABOLIC PANEL
ALT: 14 [IU]/L (ref 0–32)
AST: 21 [IU]/L (ref 0–40)
Albumin: 4.1 g/dL (ref 3.8–4.8)
Alkaline Phosphatase: 93 [IU]/L (ref 44–121)
BUN/Creatinine Ratio: 14 (ref 12–28)
BUN: 14 mg/dL (ref 8–27)
Bilirubin Total: 0.2 mg/dL (ref 0.0–1.2)
CO2: 25 mmol/L (ref 20–29)
Calcium: 9.4 mg/dL (ref 8.7–10.3)
Chloride: 101 mmol/L (ref 96–106)
Creatinine, Ser: 0.98 mg/dL (ref 0.57–1.00)
Globulin, Total: 3.1 g/dL (ref 1.5–4.5)
Glucose: 91 mg/dL (ref 70–99)
Potassium: 3.8 mmol/L (ref 3.5–5.2)
Sodium: 139 mmol/L (ref 134–144)
Total Protein: 7.2 g/dL (ref 6.0–8.5)
eGFR: 61 mL/min/{1.73_m2} (ref >59)

## 2023-01-01 LAB — LIPID PANEL
Chol/HDL Ratio: 4.1 {ratio} (ref 0.0–4.4)
Cholesterol, Total: 209 mg/dL — ABNORMAL HIGH (ref 100–199)
HDL: 51 mg/dL (ref >39)
LDL Chol Calc (NIH): 143 mg/dL — ABNORMAL HIGH (ref 0–99)
Triglycerides: 81 mg/dL (ref 0–149)
VLDL Cholesterol Cal: 15 mg/dL (ref 5–40)

## 2023-01-12 ENCOUNTER — Other Ambulatory Visit (INDEPENDENT_AMBULATORY_CARE_PROVIDER_SITE_OTHER): Payer: Self-pay | Admitting: Primary Care

## 2023-01-12 DIAGNOSIS — M8589 Other specified disorders of bone density and structure, multiple sites: Secondary | ICD-10-CM

## 2023-01-12 MED ORDER — ALENDRONATE SODIUM 70 MG PO TABS: 70.0000 mg | ORAL_TABLET | ORAL | 11 refills | Status: DC

## 2023-01-14 ENCOUNTER — Telehealth: Payer: Self-pay | Admitting: Primary Care

## 2023-01-14 NOTE — Telephone Encounter (Signed)
 Copied from CRM [id number]. Topic: General - Call Back - No Documentation >> Jan 13, 2023 11:33 AM Everette C wrote: Reason for CRM: The patient has returned a missed call from the practice  Please contact when possible

## 2023-01-14 NOTE — Telephone Encounter (Signed)
 Returned pt call noticed pt seen results on MyChart pt doesn't have any questions or concerns

## 2023-03-25 ENCOUNTER — Other Ambulatory Visit (INDEPENDENT_AMBULATORY_CARE_PROVIDER_SITE_OTHER): Payer: Self-pay | Admitting: Primary Care

## 2023-03-25 DIAGNOSIS — I10 Essential (primary) hypertension: Secondary | ICD-10-CM

## 2023-03-29 ENCOUNTER — Encounter (INDEPENDENT_AMBULATORY_CARE_PROVIDER_SITE_OTHER): Payer: Medicare HMO

## 2023-03-31 ENCOUNTER — Encounter (INDEPENDENT_AMBULATORY_CARE_PROVIDER_SITE_OTHER): Payer: Self-pay

## 2023-03-31 ENCOUNTER — Ambulatory Visit (INDEPENDENT_AMBULATORY_CARE_PROVIDER_SITE_OTHER): Payer: Medicare HMO | Admitting: *Deleted

## 2023-03-31 DIAGNOSIS — Z0000 Encounter for general adult medical examination without abnormal findings: Secondary | ICD-10-CM

## 2023-03-31 NOTE — Progress Notes (Signed)
 Subjective:   Joann Vang is a 73 y.o. female who presents for Medicare Annual (Subsequent) preventive examination.  Visit Complete: Virtual I connected with  Joann Vang on 03/31/23 by a audio enabled telemedicine application and verified that I am speaking with the correct person using two identifiers.  Patient Location: Home  Provider Location: Home Office  I discussed the limitations of evaluation and management by telemedicine. The patient expressed understanding and agreed to proceed.  Vital Signs: Because this visit was a virtual/telehealth visit, some criteria may be missing or patient reported. Any vitals not documented were not able to be obtained and vitals that have been documented are patient reported.  Patient Medicare AWV questionnaire was completed by the patient on 03-29-2023; I have confirmed that all information answered by patient is correct and no changes since this date.  Cardiac Risk Factors include: advanced age (>53men, >24 women);hypertension     Objective:    There were no vitals filed for this visit. There is no height or weight on file to calculate BMI.     03/31/2023   10:53 AM 03/25/2015    3:51 AM  Advanced Directives  Does Patient Have a Medical Advance Directive? No No    Current Medications (verified) Outpatient Encounter Medications as of 03/31/2023  Medication Sig   amLODipine (NORVASC) 2.5 MG tablet TAKE 1 TABLET BY MOUTH DAILY   atorvastatin (LIPITOR) 20 MG tablet Take 1 tablet (20 mg total) by mouth daily.   chlorthalidone (HYGROTON) 25 MG tablet TAKE 1 TABLET(25 MG) BY MOUTH DAILY   telmisartan (MICARDIS) 20 MG tablet TAKE 1 TABLET(20 MG) BY MOUTH DAILY   alendronate (FOSAMAX) 70 MG tablet Take 1 tablet (70 mg total) by mouth every 7 (seven) days. Take with a full glass of water on an empty stomach.   No facility-administered encounter medications on file as of 03/31/2023.    Allergies (verified) Patient has no known allergies.    History: Past Medical History:  Diagnosis Date   Fatty liver    GERD (gastroesophageal reflux disease)    none recent   Headache    migraines, once a year   Hypertension    Past Surgical History:  Procedure Laterality Date   TONSILLECTOMY     TUBAL LIGATION     Family History  Problem Relation Age of Onset   Congestive Heart Failure Mother    Kidney failure Father    Breast cancer Sister 29   Social History   Socioeconomic History   Marital status: Single    Spouse name: Not on file   Number of children: Not on file   Years of education: Not on file   Highest education level: Associate degree: academic program  Occupational History   Not on file  Tobacco Use   Smoking status: Former    Current packs/day: 0.00    Types: Cigarettes    Quit date: 04/10/1995    Years since quitting: 27.9   Smokeless tobacco: Never  Substance and Sexual Activity   Alcohol use: Yes    Comment: none for a month (as of 04/10/15)   Drug use: No   Sexual activity: Never  Other Topics Concern   Not on file  Social History Narrative   Not on file   Social Drivers of Health   Financial Resource Strain: Low Risk  (03/31/2023)   Overall Financial Resource Strain (CARDIA)    Difficulty of Paying Living Expenses: Not very hard  Food Insecurity: No Food Insecurity (  03/31/2023)   Hunger Vital Sign    Worried About Running Out of Food in the Last Year: Never true    Ran Out of Food in the Last Year: Never true  Transportation Needs: No Transportation Needs (03/31/2023)   PRAPARE - Administrator, Civil Service (Medical): No    Lack of Transportation (Non-Medical): No  Physical Activity: Insufficiently Active (03/31/2023)   Exercise Vital Sign    Days of Exercise per Week: 4 days    Minutes of Exercise per Session: 20 min  Stress: No Stress Concern Present (03/31/2023)   [employer] of Occupational Health - Occupational Stress Questionnaire    Feeling of Stress : Not at all   Social Connections: Unknown (03/31/2023)   Social Connection and Isolation Panel [NHANES]    Frequency of Communication with Friends and Family: Twice a week    Frequency of Social Gatherings with Friends and Family: Three times a week    Attends Religious Services: Never    Active Member of Clubs or Organizations: No    Attends Banker Meetings: Never    Marital Status: Patient declined    Tobacco Counseling Counseling given: Not Answered   Clinical Intake:  Pre-visit preparation completed: Yes  Pain : No/denies pain     Diabetes: No  How often do you need to have someone help you when you read instructions, pamphlets, or other written materials from your doctor or pharmacy?: 1 - Never  Interpreter Needed?: No  Information entered by :: Remi Haggard LPN   Activities of Daily Living    03/31/2023   10:53 AM 03/29/2023   12:22 PM  In your present state of health, do you have any difficulty performing the following activities:  Hearing? 0 0  Vision? 0 0  Difficulty concentrating or making decisions? 0 0  Walking or climbing stairs? 0 0  Dressing or bathing? 0 0  Doing errands, shopping? 0 0  Preparing Food and eating ? N N  Using the Toilet? N N  In the past six months, have you accidently leaked urine? N N  Do you have problems with loss of bowel control? N N  Managing your Medications? N N  Managing your Finances? N N  Housekeeping or managing your Housekeeping? N N    Patient Care Team: Grayce Sessions, NP as PCP - General (Internal Medicine)  Indicate any recent Medical Services you may have received from other than [facility] providers in the past year (date may be approximate).     Assessment:   This is a routine wellness examination for Joann Vang.  Hearing/Vision screen Hearing Screening - Comments:: No trouble hearing Vision Screening - Comments:: Up to date [facility]   Goals Addressed             This Visit's Progress    Patient  Stated       Stay healthy       Depression Screen    03/31/2023   10:54 AM 12/31/2022    2:53 PM 08/24/2022    9:52 AM 05/22/2022   10:16 AM 01/26/2022    9:10 AM 07/21/2021   10:23 AM 05/08/2021   10:05 AM  PHQ 2/9 Scores  PHQ - 2 Score 0 0 0 0 0 0 0  PHQ- 9 Score 0          Fall Risk    03/31/2023   10:53 AM 03/29/2023   12:22 PM 12/31/2022    2:49 PM  08/24/2022    9:52 AM 05/22/2022   10:16 AM  Fall Risk   Falls in the past year? 0 0 0 0 0  Number falls in past yr: 0  0 0 0  Injury with Fall? 0  0 0 0  Risk for fall due to :   No Fall Risks No Fall Risks No Fall Risks  Follow up Falls evaluation completed;Education provided;Falls prevention discussed        MEDICARE RISK AT HOME: Medicare Risk at Home Any stairs in or around the home?: No If so, are there any without handrails?: No Home free of loose throw rugs in walkways, pet beds, electrical cords, etc?: No Adequate lighting in your home to reduce risk of falls?: Yes Life alert?: No Use of a cane, walker or w/c?: No Grab bars in the bathroom?: No Shower chair or bench in shower?: No Elevated toilet seat or a handicapped toilet?: No  TIMED UP AND GO:  Was the test performed?  No    Cognitive Function:        03/31/2023   10:52 AM  6CIT Screen  What Year? 0 points  What month? 0 points  What time? 0 points  Count back from 20 0 points  Months in reverse 0 points  Repeat phrase 0 points  Total Score 0 points    Immunizations  There is no immunization history on file for this patient.  TDAP status: Due, Education has been provided regarding the importance of this vaccine. Advised may receive this vaccine at local pharmacy or Health Dept. Aware to provide a copy of the vaccination record if obtained from local pharmacy or Health Dept. Verbalized acceptance and understanding.  Flu Vaccine status: Due, Education has been provided regarding the importance of this vaccine. Advised may receive this vaccine at  local pharmacy or Health Dept. Aware to provide a copy of the vaccination record if obtained from local pharmacy or Health Dept. Verbalized acceptance and understanding.  Pneumococcal vaccine status: Due, Education has been provided regarding the importance of this vaccine. Advised may receive this vaccine at local pharmacy or Health Dept. Aware to provide a copy of the vaccination record if obtained from local pharmacy or Health Dept. Verbalized acceptance and understanding. Covid-19 vaccine status: Information provided on how to obtain vaccines.   Qualifies for Shingles Vaccine? Yes   Zostavax completed No   Shingrix Completed?: No.    Education has been provided regarding the importance of this vaccine. Patient has been advised to call insurance company to determine out of pocket expense if they have not yet received this vaccine. Advised may also receive vaccine at local pharmacy or Health Dept. Verbalized acceptance and understanding.  Screening Tests Health Maintenance  Topic Date Due   COVID-19 Vaccine (1) Never done   Pneumonia Vaccine 58+ Years old (1 of 2 - PCV) Never done   Zoster Vaccines- Shingrix (1 of 2) 03/31/2023 (Originally 11/01/1969)   INFLUENZA VACCINE  04/05/2023 (Originally 08/06/2022)   Medicare Annual Wellness (AWV)  03/30/2024   MAMMOGRAM  08/13/2024   Colonoscopy  07/19/2029   DEXA SCAN  Completed   Hepatitis C Screening  Completed   HPV VACCINES  Aged Out   DTaP/Tdap/Td  Discontinued    Health Maintenance  Health Maintenance Due  Topic Date Due   COVID-19 Vaccine (1) Never done   Pneumonia Vaccine 71+ Years old (1 of 2 - PCV) Never done    Colorectal cancer screening: Type of screening: Colonoscopy.  Completed 2013. Repeat every 10 years  Mammogram status: Completed  . Repeat every year   Bone Density status: Completed 2024. Results reflect: Bone density results: OSTEOPOROSIS. Repeat every 2 years.  Lung Cancer Screening: (Low Dose CT Chest recommended  if Age 29-80 years, 20 pack-year currently smoking OR have quit w/in 15years.) does not qualify.   Lung Cancer Screening Referral:  Additional Screening:  Hepatitis C Screening: does not qualify; Completed 2017  Vision Screening: Recommended annual ophthalmology exams for early detection of glaucoma and other disorders of the eye. Is the patient up to date with their annual eye exam?  Yes  Who is the provider or what is the name of the office in which the patient attends annual eye exams? [facility] If pt is not established with a provider, would they like to be referred to a provider to establish care? No .   Dental Screening: Recommended annual dental exams for proper oral hygiene    Community Resource Referral / Chronic Care Management: CRR required this visit?  No   CCM required this visit?  No     Plan:     I have personally reviewed and noted the following in the patient's chart:   Medical and social history Use of alcohol, tobacco or illicit drugs  Current medications and supplements including opioid prescriptions. Patient is not currently taking opioid prescriptions. Functional ability and status Nutritional status Physical activity Advanced directives List of other physicians Hospitalizations, surgeries, and ER visits in previous 12 months Vitals Screenings to include cognitive, depression, and falls Referrals and appointments  In addition, I have reviewed and discussed with patient certain preventive protocols, quality metrics, and best practice recommendations. A written personalized care plan for preventive services as well as general preventive health recommendations were provided to patient.     Remi Haggard, LPN   04/13/8117   After Visit Summary: (MyChart) Due to this being a telephonic visit, the after visit summary with patients personalized plan was offered to patient via MyChart   Nurse Notes:

## 2023-03-31 NOTE — Patient Instructions (Signed)
 Joann Vang , Thank you for taking time to come for your Medicare Wellness Visit. I appreciate your ongoing commitment to your health goals. Please review the following plan we discussed and let me know if I can assist you in the future.   Screening recommendations/referrals: Colonoscopy: no longer required Mammogram: up to date Bone Density: up to date Recommended yearly ophthalmology/optometry visit for glaucoma screening and checkup Recommended yearly dental visit for hygiene and checkup  Vaccinations: Influenza vaccine: Education provided Pneumococcal vaccine: Education provided Tdap vaccine: Education provided Shingles vaccine: Education provided    Advanced directives: Education provided     Preventive Care 65 Years and Older, Female Preventive care refers to lifestyle choices and visits with your health care provider that can promote health and wellness. What does preventive care include? A yearly physical exam. This is also called an annual well check. Dental exams once or twice a year. Routine eye exams. Ask your health care provider how often you should have your eyes checked. Personal lifestyle choices, including: Daily care of your teeth and gums. Regular physical activity. Eating a healthy diet. Avoiding tobacco and drug use. Limiting alcohol use. Practicing safe sex. Taking low-dose aspirin every day. Taking vitamin and mineral supplements as recommended by your health care provider. What happens during an annual well check? The services and screenings done by your health care provider during your annual well check will depend on your age, overall health, lifestyle risk factors, and family history of disease. Counseling  Your health care provider may ask you questions about your: Alcohol use. Tobacco use. Drug use. Emotional well-being. Home and relationship well-being. Sexual activity. Eating habits. History of falls. Memory and ability to understand  (cognition). Work and work Astronomer. Reproductive health. Screening  You may have the following tests or measurements: Height, weight, and BMI. Blood pressure. Lipid and cholesterol levels. These may be checked every 5 years, or more frequently if you are over 45 years old. Skin check. Lung cancer screening. You may have this screening every year starting at age 74 if you have a 30-pack-year history of smoking and currently smoke or have quit within the past 15 years. Fecal occult blood test (FOBT) of the stool. You may have this test every year starting at age 74. Flexible sigmoidoscopy or colonoscopy. You may have a sigmoidoscopy every 5 years or a colonoscopy every 10 years starting at age 74. Hepatitis C blood test. Hepatitis B blood test. Sexually transmitted disease (STD) testing. Diabetes screening. This is done by checking your blood sugar (glucose) after you have not eaten for a while (fasting). You may have this done every 1-3 years. Bone density scan. This is done to screen for osteoporosis. You may have this done starting at age 74. Mammogram. This may be done every 1-2 years. Talk to your health care provider about how often you should have regular mammograms. Talk with your health care provider about your test results, treatment options, and if necessary, the need for more tests. Vaccines  Your health care provider may recommend certain vaccines, such as: Influenza vaccine. This is recommended every year. Tetanus, diphtheria, and acellular pertussis (Tdap, Td) vaccine. You may need a Td booster every 10 years. Zoster vaccine. You may need this after age 74. Pneumococcal 13-valent conjugate (PCV13) vaccine. One dose is recommended after age 74. Pneumococcal polysaccharide (PPSV23) vaccine. One dose is recommended after age 74. Talk to your health care provider about which screenings and vaccines you need and how often you need  them. This information is not intended to  replace advice given to you by your health care provider. Make sure you discuss any questions you have with your health care provider. Document Released: 01/18/2015 Document Revised: 09/11/2015 Document Reviewed: 10/23/2014 Elsevier Interactive Patient Education  2017 [employer].  Fall Prevention in the Home Falls can cause injuries. They can happen to people of all ages. There are many things you can do to make your home safe and to help prevent falls. What can I do on the outside of my home? Regularly fix the edges of walkways and driveways and fix any cracks. Remove anything that might make you trip as you walk through a door, such as a raised step or threshold. Trim any bushes or trees on the path to your home. Use bright outdoor lighting. Clear any walking paths of anything that might make someone trip, such as rocks or tools. Regularly check to see if handrails are loose or broken. Make sure that both sides of any steps have handrails. Any raised decks and porches should have guardrails on the edges. Have any leaves, snow, or ice cleared regularly. Use sand or salt on walking paths during winter. Clean up any spills in your garage right away. This includes oil or grease spills. What can I do in the bathroom? Use night lights. Install grab bars by the toilet and in the tub and shower. Do not use towel bars as grab bars. Use non-skid mats or decals in the tub or shower. If you need to sit down in the shower, use a plastic, non-slip stool. Keep the floor dry. Clean up any water that spills on the floor as soon as it happens. Remove soap buildup in the tub or shower regularly. Attach bath mats securely with double-sided non-slip rug tape. Do not have throw rugs and other things on the floor that can make you trip. What can I do in the bedroom? Use night lights. Make sure that you have a light by your bed that is easy to reach. Do not use any sheets or blankets that are too big for  your bed. They should not hang down onto the floor. Have a firm chair that has side arms. You can use this for support while you get dressed. Do not have throw rugs and other things on the floor that can make you trip. What can I do in the kitchen? Clean up any spills right away. Avoid walking on wet floors. Keep items that you use a lot in easy-to-reach places. If you need to reach something above you, use a strong step stool that has a grab bar. Keep electrical cords out of the way. Do not use floor polish or wax that makes floors slippery. If you must use wax, use non-skid floor wax. Do not have throw rugs and other things on the floor that can make you trip. What can I do with my stairs? Do not leave any items on the stairs. Make sure that there are handrails on both sides of the stairs and use them. Fix handrails that are broken or loose. Make sure that handrails are as long as the stairways. Check any carpeting to make sure that it is firmly attached to the stairs. Fix any carpet that is loose or worn. Avoid having throw rugs at the top or bottom of the stairs. If you do have throw rugs, attach them to the floor with carpet tape. Make sure that you have a light switch at  the top of the stairs and the bottom of the stairs. If you do not have them, ask someone to add them for you. What else can I do to help prevent falls? Wear shoes that: Do not have high heels. Have rubber bottoms. Are comfortable and fit you well. Are closed at the toe. Do not wear sandals. If you use a stepladder: Make sure that it is fully opened. Do not climb a closed stepladder. Make sure that both sides of the stepladder are locked into place. Ask someone to hold it for you, if possible. Clearly mark and make sure that you can see: Any grab bars or handrails. First and last steps. Where the edge of each step is. Use tools that help you move around (mobility aids) if they are needed. These  include: Canes. Walkers. Scooters. Crutches. Turn on the lights when you go into a dark area. Replace any light bulbs as soon as they burn out. Set up your furniture so you have a clear path. Avoid moving your furniture around. If any of your floors are uneven, fix them. If there are any pets around you, be aware of where they are. Review your medicines with your doctor. Some medicines can make you feel dizzy. This can increase your chance of falling. Ask your doctor what other things that you can do to help prevent falls. This information is not intended to replace advice given to you by your health care provider. Make sure you discuss any questions you have with your health care provider. Document Released: 10/18/2008 Document Revised: 05/30/2015 Document Reviewed: 01/26/2014 Elsevier Interactive Patient Education  2017 [employer].

## 2023-07-01 ENCOUNTER — Ambulatory Visit (INDEPENDENT_AMBULATORY_CARE_PROVIDER_SITE_OTHER): Payer: Medicare HMO | Admitting: Primary Care

## 2023-07-01 ENCOUNTER — Encounter (INDEPENDENT_AMBULATORY_CARE_PROVIDER_SITE_OTHER): Payer: Self-pay | Admitting: Primary Care

## 2023-07-01 VITALS — BP 138/80 | HR 62 | Resp 16 | Ht 63.0 in | Wt 158.8 lb

## 2023-07-01 DIAGNOSIS — E782 Mixed hyperlipidemia: Secondary | ICD-10-CM | POA: Diagnosis not present

## 2023-07-01 DIAGNOSIS — Z2821 Immunization not carried out because of patient refusal: Secondary | ICD-10-CM | POA: Diagnosis not present

## 2023-07-01 DIAGNOSIS — I10 Essential (primary) hypertension: Secondary | ICD-10-CM | POA: Diagnosis not present

## 2023-07-01 NOTE — Progress Notes (Signed)
 [facility]   Joann Vang is a 73 y.o. female presents for hypertension evaluation, Denies shortness of breath, headaches, chest pain or lower extremity edema, sudden onset, vision changes, unilateral weakness, dizziness, paresthesias .  Blood pressure is elevated however she does take her blood pressures at home and her readings range from systolic 120-140 diastolic 60-75  Patient reports adherence with medications.  Dietary habits include: monitor sodium Exercise habits include:yes Family / Social history: breast cancer   Past Medical History:  Diagnosis Date   Fatty liver    GERD (gastroesophageal reflux disease)    none recent   Headache    migraines, once a year   Hypertension    Past Surgical History:  Procedure Laterality Date   TONSILLECTOMY     TUBAL LIGATION     No Known Allergies Current Outpatient Medications on File Prior to Visit  Medication Sig Dispense Refill   alendronate  (FOSAMAX ) 70 MG tablet Take 1 tablet (70 mg total) by mouth every 7 (seven) days. Take with a full glass of water on an empty stomach. 4 tablet 11   amLODipine  (NORVASC ) 2.5 MG tablet TAKE 1 TABLET BY MOUTH DAILY 90 tablet 0   atorvastatin  (LIPITOR) 20 MG tablet Take 1 tablet (20 mg total) by mouth daily. 90 tablet 2   chlorthalidone  (HYGROTON ) 25 MG tablet TAKE 1 TABLET(25 MG) BY MOUTH DAILY 90 tablet 0   telmisartan  (MICARDIS ) 20 MG tablet TAKE 1 TABLET(20 MG) BY MOUTH DAILY 90 tablet 0   No current facility-administered medications on file prior to visit.   Social History   Socioeconomic History   Marital status: Single    Spouse name: Not on file   Number of children: Not on file   Years of education: Not on file   Highest education level: Associate degree: occupational, Scientist, product/process development, or vocational program  Occupational History   Not on file  Tobacco Use   Smoking status: Former    Current packs/day: 0.00    Types: Cigarettes    Quit date: 04/10/1995    Years  since quitting: 28.2   Smokeless tobacco: Never  Substance and Sexual Activity   Alcohol use: Yes    Comment: none for a month (as of 04/10/15)   Drug use: No   Sexual activity: Never  Other Topics Concern   Not on file  Social History Narrative   Not on file   Social Drivers of Health   Financial Resource Strain: Low Risk  (06/27/2023)   Overall Financial Resource Strain (CARDIA)    Difficulty of Paying Living Expenses: Not hard at all  Food Insecurity: No Food Insecurity (06/27/2023)   Hunger Vital Sign    Worried About Running Out of Food in the Last Year: Never true    Ran Out of Food in the Last Year: Never true  Transportation Needs: No Transportation Needs (06/27/2023)   PRAPARE - Administrator, Civil Service (Medical): No    Lack of Transportation (Non-Medical): No  Physical Activity: Insufficiently Active (06/27/2023)   Exercise Vital Sign    Days of Exercise per Week: 5 days    Minutes of Exercise per Session: 10 min  Stress: No Stress Concern Present (06/27/2023)   [employer] of Occupational Health - Occupational Stress Questionnaire    Feeling of Stress: Only a little  Social Connections: Socially Isolated (06/27/2023)   Social Connection and Isolation Panel    Frequency of Communication with Friends and Family: Twice a week  Frequency of Social Gatherings with Friends and Family: Once a week    Attends Religious Services: Never    Database administrator or Organizations: No    Attends Engineer, structural: Not on file    Marital Status: Divorced  Intimate Partner Violence: Not At Risk (03/31/2023)   Humiliation, Afraid, Rape, and Kick questionnaire    Fear of Current or Ex-Partner: No    Emotionally Abused: No    Physically Abused: No    Sexually Abused: No   Family History  Problem Relation Age of Onset   Congestive Heart Failure Mother    Kidney failure Father    Breast cancer Sister 58   Health Maintenance  Topic Date Due    COVID-19 Vaccine (1) Never done   Zoster Vaccines- Shingrix (1 of 2) Never done   Hepatitis B Vaccines (1 of 3 - Risk 3-dose series) Never done   Pneumococcal Vaccine: 50+ Years (1 of 2 - PCV) 06/30/2024 (Originally 11/01/1969)   INFLUENZA VACCINE  08/06/2023   Medicare Annual Wellness (AWV)  03/30/2024   MAMMOGRAM  08/13/2024   Colonoscopy  07/19/2029   DEXA SCAN  Completed   Hepatitis C Screening  Completed   HPV VACCINES  Aged Out   Meningococcal B Vaccine  Aged Out   DTaP/Tdap/Td  Discontinued     OBJECTIVE:  Vitals:   07/01/23 1034 07/01/23 1035  BP: (!) 152/92 (!) 161/81  Pulse: 62   Resp: 16   SpO2: 100%   Weight: 158 lb 12.8 oz (72 kg)   Height: 5' 3 (1.6 m)     Physical Exam Vitals reviewed.  Constitutional:      Appearance: Normal appearance.  HENT:     Head: Normocephalic.     Right Ear: Tympanic membrane, ear canal and external ear normal.     Left Ear: Tympanic membrane, ear canal and external ear normal.     Nose: Nose normal.     Mouth/Throat:     Mouth: Mucous membranes are moist.   Eyes:     Extraocular Movements: Extraocular movements intact.     Pupils: Pupils are equal, round, and reactive to light.    Cardiovascular:     Rate and Rhythm: Normal rate.  Pulmonary:     Effort: Pulmonary effort is normal.     Breath sounds: Normal breath sounds.  Abdominal:     General: Bowel sounds are normal.     Palpations: Abdomen is soft.   Musculoskeletal:        General: Normal range of motion.     Cervical back: Normal range of motion.   Skin:    General: Skin is warm and dry.   Neurological:     Mental Status: She is alert and oriented to person, place, and time.   Psychiatric:        Mood and Affect: Mood normal.        Behavior: Behavior normal.        Thought Content: Thought content normal.      ROS  Last 3 Office BP readings: BP Readings from Last 3 Encounters:  07/01/23 (!) 161/81  12/31/22 (!) 143/84  08/24/22 (!) 158/83     BMET    Component Value Date/Time   NA 139 12/31/2022 1533   K 3.8 12/31/2022 1533   CL 101 12/31/2022 1533   CO2 25 12/31/2022 1533   GLUCOSE 91 12/31/2022 1533   BUN 14 12/31/2022 1533   CREATININE 0.98  12/31/2022 1533   CALCIUM  9.4 12/31/2022 1533   GFRNONAA 67 01/15/2020 0926   GFRAA 77 01/15/2020 0926    Renal function: CrCl cannot be calculated (Patient's most recent lab result is older than the maximum 21 days allowed.).  Clinical ASCVD: Yes  The 10-year ASCVD risk score (Arnett DK, et al., 2019) is: 20.2%   Values used to calculate the score:     Age: 50 years     Clincally relevant sex: Female     Is Non-Hispanic African American: Yes     Diabetic: No     Tobacco smoker: No     Systolic Blood Pressure: 161 mmHg     Is BP treated: Yes     HDL Cholesterol: 51 mg/dL     Total Cholesterol: 209 mg/dL  ASCVD risk factors include- [place]   ASSESSMENT & PLAN: Joann Vang was seen today for hypertension and hyperlipidemia.  Diagnoses and all orders for this visit:  Mixed hyperlipidemia  -     Lipid panel  Pneumococcal vaccination declined    Essential hypertension -Counseled on lifestyle modifications for blood pressure control including reduced dietary sodium, increased exercise, weight reduction and adequate sleep. Also, educated patient about the risk for cardiovascular events, stroke and heart attack. Also counseled patient about the importance of medication adherence. If you participate in smoking, it is important to stop using tobacco as this will increase the risks associated with uncontrolled blood pressure.   Minimize salt intake. Minimize alcohol intake      CBC with Differential/Platelet -     CMP14+EGFR   This note has been created with Education officer, environmental. Any transcriptional errors are unintentional.   Joann SHAUNNA Bohr, NP 07/01/2023, 11:19 AM

## 2023-07-02 ENCOUNTER — Ambulatory Visit (INDEPENDENT_AMBULATORY_CARE_PROVIDER_SITE_OTHER): Payer: Self-pay | Admitting: Primary Care

## 2023-07-02 LAB — LIPID PANEL
Chol/HDL Ratio: 3.9 ratio (ref 0.0–4.4)
Cholesterol, Total: 213 mg/dL — ABNORMAL HIGH (ref 100–199)
HDL: 55 mg/dL (ref >39)
LDL Chol Calc (NIH): 143 mg/dL — ABNORMAL HIGH (ref 0–99)
Triglycerides: 82 mg/dL (ref 0–149)
VLDL Cholesterol Cal: 15 mg/dL (ref 5–40)

## 2023-07-02 LAB — CBC WITH DIFFERENTIAL/PLATELET
Basophils Absolute: 0 10*3/uL (ref 0.0–0.2)
Basos: 0 %
EOS (ABSOLUTE): 0 10*3/uL (ref 0.0–0.4)
Eos: 0 %
Hematocrit: 44.2 % (ref 34.0–46.6)
Hemoglobin: 14 g/dL (ref 11.1–15.9)
Immature Grans (Abs): 0 10*3/uL (ref 0.0–0.1)
Immature Granulocytes: 0 %
Lymphocytes Absolute: 2.7 10*3/uL (ref 0.7–3.1)
Lymphs: 56 %
MCH: 29.4 pg (ref 26.6–33.0)
MCHC: 31.7 g/dL (ref 31.5–35.7)
MCV: 93 fL (ref 79–97)
Monocytes Absolute: 0.7 10*3/uL (ref 0.1–0.9)
Monocytes: 14 %
Neutrophils Absolute: 1.5 10*3/uL (ref 1.4–7.0)
Neutrophils: 30 %
Platelets: 167 10*3/uL (ref 150–450)
RBC: 4.76 x10E6/uL (ref 3.77–5.28)
RDW: 13.2 % (ref 11.7–15.4)
WBC: 4.9 10*3/uL (ref 3.4–10.8)

## 2023-07-02 LAB — CMP14+EGFR
ALT: 13 IU/L (ref 0–32)
AST: 20 IU/L (ref 0–40)
Albumin: 4.6 g/dL (ref 3.8–4.8)
Alkaline Phosphatase: 84 IU/L (ref 44–121)
BUN/Creatinine Ratio: 12 (ref 12–28)
BUN: 10 mg/dL (ref 8–27)
Bilirubin Total: 0.5 mg/dL (ref 0.0–1.2)
CO2: 26 mmol/L (ref 20–29)
Calcium: 10.1 mg/dL (ref 8.7–10.3)
Chloride: 98 mmol/L (ref 96–106)
Creatinine, Ser: 0.82 mg/dL (ref 0.57–1.00)
Globulin, Total: 3.4 g/dL (ref 1.5–4.5)
Glucose: 83 mg/dL (ref 70–99)
Potassium: 3.6 mmol/L (ref 3.5–5.2)
Sodium: 141 mmol/L (ref 134–144)
Total Protein: 8 g/dL (ref 6.0–8.5)
eGFR: 75 mL/min/{1.73_m2} (ref >59)

## 2023-07-12 DIAGNOSIS — H524 Presbyopia: Secondary | ICD-10-CM | POA: Diagnosis not present

## 2023-07-12 DIAGNOSIS — H2513 Age-related nuclear cataract, bilateral: Secondary | ICD-10-CM | POA: Diagnosis not present

## 2023-07-12 DIAGNOSIS — H43393 Other vitreous opacities, bilateral: Secondary | ICD-10-CM | POA: Diagnosis not present

## 2023-07-14 DIAGNOSIS — H524 Presbyopia: Secondary | ICD-10-CM | POA: Diagnosis not present

## 2023-07-14 DIAGNOSIS — Z0100 Encounter for examination of eyes and vision without abnormal findings: Secondary | ICD-10-CM | POA: Diagnosis not present

## 2023-07-16 ENCOUNTER — Other Ambulatory Visit (INDEPENDENT_AMBULATORY_CARE_PROVIDER_SITE_OTHER): Payer: Self-pay

## 2023-07-16 ENCOUNTER — Other Ambulatory Visit: Payer: Self-pay | Admitting: Primary Care

## 2023-07-16 DIAGNOSIS — I10 Essential (primary) hypertension: Secondary | ICD-10-CM

## 2023-07-16 DIAGNOSIS — Z1231 Encounter for screening mammogram for malignant neoplasm of breast: Secondary | ICD-10-CM

## 2023-07-16 MED ORDER — CHLORTHALIDONE 25 MG PO TABS: 25.0000 mg | ORAL_TABLET | Freq: Every day | ORAL | 1 refills | Status: DC

## 2023-07-16 MED ORDER — TELMISARTAN 20 MG PO TABS: 20.0000 mg | ORAL_TABLET | Freq: Every day | ORAL | 1 refills | Status: DC

## 2023-07-16 MED ORDER — AMLODIPINE BESYLATE 2.5 MG PO TABS: 2.5000 mg | ORAL_TABLET | Freq: Every day | ORAL | 1 refills | Status: DC

## 2023-08-16 ENCOUNTER — Ambulatory Visit
Admission: RE | Admit: 2023-08-16 | Discharge: 2023-08-16 | Disposition: A | Discharge status: Home / Self Care | Source: Ambulatory Visit | Attending: Primary Care | Admitting: Primary Care

## 2023-08-16 DIAGNOSIS — Z1231 Encounter for screening mammogram for malignant neoplasm of breast: Secondary | ICD-10-CM | POA: Diagnosis not present

## 2023-11-30 ENCOUNTER — Telehealth: Payer: Self-pay

## 2023-11-30 NOTE — Telephone Encounter (Signed)
 Will forward to provider

## 2023-11-30 NOTE — Telephone Encounter (Signed)
 Copied from CRM #[id number]. Topic: Clinical - Medical Advice >> Nov 30, 2023  9:40 AM Antwanette L wrote: Reason for CRM:  Mitzie, Care Manager with [facility] is calling b/c the patient was previously diagnosed with high cholestrol; atorvastatin  was prescribed but has since been discontinued.Patient has osteoporosis but is not currently taking medication; patient reports this was discussed with the provider. Patient states she was never diagnosed with cholelithiasis or cholecystitis.Mitzie is requesting confirmation of this information and a callback at .   Mitzie is trying to confirm this information and is requesting a callback at [phone]

## 2023-12-07 NOTE — Telephone Encounter (Signed)
 Left message on Mitzie proper rtn call

## 2023-12-08 NOTE — Telephone Encounter (Signed)
 [name] from [employer] [facility] to speak with Joann Vang, she patient, stopped taking, alendronate  and wants to know should she have stopped taking this, also the atorvastatin , should she still be taking and with previous message about patient states never being diagnosed with cholelithiasis or cholecystitis

## 2024-01-03 ENCOUNTER — Ambulatory Visit (INDEPENDENT_AMBULATORY_CARE_PROVIDER_SITE_OTHER): Admitting: Primary Care

## 2024-01-03 ENCOUNTER — Encounter (INDEPENDENT_AMBULATORY_CARE_PROVIDER_SITE_OTHER): Payer: Self-pay | Admitting: Primary Care

## 2024-01-03 VITALS — BP 136/78 | HR 65 | Resp 16 | Wt 162.6 lb

## 2024-01-03 DIAGNOSIS — I10 Essential (primary) hypertension: Secondary | ICD-10-CM | POA: Diagnosis not present

## 2024-01-03 DIAGNOSIS — E782 Mixed hyperlipidemia: Secondary | ICD-10-CM

## 2024-01-03 DIAGNOSIS — Z760 Encounter for issue of repeat prescription: Secondary | ICD-10-CM | POA: Diagnosis not present

## 2024-01-03 MED ORDER — TELMISARTAN 20 MG PO TABS: 20.0000 mg | ORAL_TABLET | Freq: Every day | ORAL | 1 refills | Status: AC

## 2024-01-03 MED ORDER — CHLORTHALIDONE 25 MG PO TABS: 25.0000 mg | ORAL_TABLET | Freq: Every day | ORAL | 1 refills | Status: AC

## 2024-01-03 MED ORDER — AMLODIPINE BESYLATE 2.5 MG PO TABS: 2.5000 mg | ORAL_TABLET | Freq: Every day | ORAL | 1 refills | Status: AC

## 2024-01-03 NOTE — Progress Notes (Signed)
 " [facility]   Joann Vang is a 73 y.o. female presents for hypertension evaluation, Denies shortness of breath, headaches, chest pain or lower extremity edema, sudden onset, vision changes, unilateral weakness, dizziness, paresthesias   Patient reports adherence with medications.  Dietary habits include: low sodium diet and cholesterol  Exercise habits include:yes riding bicycle  Family / Social history: mother HTN, Father kidney disease and sister  breast Ca   Past Medical History:  Diagnosis Date   Fatty liver    GERD (gastroesophageal reflux disease)    none recent   Headache    migraines, once a year   Hypertension    Past Surgical History:  Procedure Laterality Date   TONSILLECTOMY     TUBAL LIGATION     Allergies[1] Medications Ordered Prior to Encounter[2] Social History   Socioeconomic History   Marital status: Single    Spouse name: Not on file   Number of children: Not on file   Years of education: Not on file   Highest education level: Some college, no degree  Occupational History   Not on file  Tobacco Use   Smoking status: Former    Current packs/day: 0.00    Types: Cigarettes    Quit date: 04/10/1995    Years since quitting: 28.7   Smokeless tobacco: Never  Substance and Sexual Activity   Alcohol use: Yes    Comment: none for a month (as of 04/10/15)   Drug use: No   Sexual activity: Never  Other Topics Concern   Not on file  Social History Narrative   Not on file   Social Drivers of Health   Tobacco Use: Medium Risk (01/03/2024)   Patient History    Smoking Tobacco Use: Former    Smokeless Tobacco Use: Never    Passive Exposure: Not on file  Financial Resource Strain: Low Risk (06/27/2023)   Overall Financial Resource Strain (CARDIA)    Difficulty of Paying Living Expenses: Not hard at all  Food Insecurity: No Food Insecurity (06/27/2023)   Epic    Worried About Radiation Protection Practitioner of Food in the Last Year: Never true    Ran Out  of Food in the Last Year: Never true  Transportation Needs: No Transportation Needs (12/30/2023)   Epic    Lack of Transportation (Medical): No    Lack of Transportation (Non-Medical): No  Physical Activity: Insufficiently Active (12/30/2023)   Exercise Vital Sign    Days of Exercise per Week: 7 days    Minutes of Exercise per Session: 10 min  Stress: No Stress Concern Present (12/30/2023)   [employer] of Occupational Health - Occupational Stress Questionnaire    Feeling of Stress: Not at all  Social Connections: Unknown (12/30/2023)   Social Connection and Isolation Panel    Frequency of Communication with Friends and Family: Three times a week    Frequency of Social Gatherings with Friends and Family: Twice a week    Attends Religious Services: Never    Database Administrator or Organizations: No    Attends Engineer, Structural: Not on file    Marital Status: Patient declined  Intimate Partner Violence: Not At Risk (03/31/2023)   Humiliation, Afraid, Rape, and Kick questionnaire    Fear of Current or Ex-Partner: No    Emotionally Abused: No    Physically Abused: No    Sexually Abused: No  Depression (PHQ2-9): Low Risk (01/03/2024)   Depression (PHQ2-9)    PHQ-2 Score: 0  Alcohol Screen: Low Risk (03/31/2023)   Alcohol Screen    Last Alcohol Screening Score (AUDIT): 0  Housing: Unknown (12/30/2023)   Epic    Unable to Pay for Housing in the Last Year: No    Number of Times Moved in the Last Year: Not on file    Homeless in the Last Year: No  Utilities: Not At Risk (03/31/2023)   [employer] Utilities    Threatened with loss of utilities: No  Health Literacy: Adequate Health Literacy (03/31/2023)   B1300 Health Literacy    Frequency of need for help with medical instructions: Never   Family History  Problem Relation Age of Onset   Congestive Heart Failure Mother    Kidney failure Father    Breast cancer Sister 59   Health Maintenance  Topic Date Due   COVID-19  Vaccine (1) Never done   Zoster Vaccines- Shingrix (1 of 2) 04/02/2024 (Originally 11/01/1969)   Influenza Vaccine  04/04/2024 (Originally 08/06/2023)   Pneumococcal Vaccine: 50+ Years (1 of 2 - PCV) 06/30/2024 (Originally 11/01/1969)   Medicare Annual Wellness (AWV)  03/30/2024   Mammogram  08/15/2025   Colonoscopy  07/19/2029   Bone Density Scan  Completed   Hepatitis C Screening  Completed   Meningococcal B Vaccine  Aged Out   DTaP/Tdap/Td  Discontinued     OBJECTIVE:  Vitals:   01/03/24 1041  BP: (!) 152/89  Pulse: 65  Resp: 16  SpO2: 98%  Weight: 162 lb 9.6 oz (73.8 kg)    Physical Exam Vitals reviewed.  Constitutional:      Appearance: Normal appearance. She is normal weight.  HENT:     Head: Normocephalic.     Right Ear: Tympanic membrane, ear canal and external ear normal.     Left Ear: Tympanic membrane, ear canal and external ear normal.     Nose: Nose normal.     Mouth/Throat:     Mouth: Mucous membranes are moist.  Eyes:     Extraocular Movements: Extraocular movements intact.     Pupils: Pupils are equal, round, and reactive to light.  Cardiovascular:     Rate and Rhythm: Normal rate.  Pulmonary:     Effort: Pulmonary effort is normal.     Breath sounds: Normal breath sounds.  Abdominal:     General: Bowel sounds are normal.     Palpations: Abdomen is soft.  Musculoskeletal:        General: Normal range of motion.     Cervical back: Normal range of motion.  Skin:    General: Skin is warm and dry.  Neurological:     Mental Status: She is alert and oriented to person, place, and time.  Psychiatric:        Mood and Affect: Mood normal.        Behavior: Behavior normal.        Thought Content: Thought content normal.     ROS  Last 3 Office BP readings: BP Readings from Last 3 Encounters:  01/03/24 (!) 152/89  07/01/23 138/80  12/31/22 (!) 143/84    BMET    Component Value Date/Time   NA 141 07/01/2023 1023   K 3.6 07/01/2023 1023   CL  98 07/01/2023 1023   CO2 26 07/01/2023 1023   GLUCOSE 83 07/01/2023 1023   BUN 10 07/01/2023 1023   CREATININE 0.82 07/01/2023 1023   CALCIUM  10.1 07/01/2023 1023   GFRNONAA 67 01/15/2020 0926   GFRAA 77 01/15/2020 0926  Renal function: CrCl cannot be calculated (Patient's most recent lab result is older than the maximum 21 days allowed.).  Clinical ASCVD: Yes  The 10-year ASCVD risk score (Arnett DK, et al., 2019) is: 19.8%   Values used to calculate the score:     Age: 45 years     Clinically relevant sex: Female     Is Non-Hispanic African American: Yes     Diabetic: No     Tobacco smoker: No     Systolic Blood Pressure: 152 mmHg     Is BP treated: Yes     HDL Cholesterol: 55 mg/dL     Total Cholesterol: 213 mg/dL  ASCVD risk factors include- CHAD   ASSESSMENT & PLAN:  Meds ordered this encounter  Medications   amLODipine  (NORVASC ) 2.5 MG tablet    Sig: Take 1 tablet (2.5 mg total) by mouth daily.    Dispense:  90 tablet    Refill:  1   chlorthalidone  (HYGROTON ) 25 MG tablet    Sig: Take 1 tablet (25 mg total) by mouth daily.    Dispense:  90 tablet    Refill:  1   telmisartan  (MICARDIS ) 20 MG tablet    Sig: Take 1 tablet (20 mg total) by mouth daily.    Dispense:  90 tablet    Refill:  1   Joann Vang was seen today for hypertension and hyperlipidemia.  Diagnoses and all orders for this visit:  Mixed hyperlipidemia -     Lipid panel  -     CBC with Differential/Platelet -     CMP14+EGFR Medication refill -     amLODipine  (NORVASC ) 2.5 MG tablet; Take 1 tablet (2.5 mg total) by mouth daily. -     chlorthalidone  (HYGROTON ) 25 MG tablet; Take 1 tablet (25 mg total) by mouth daily. -     telmisartan  (MICARDIS ) 20 MG tablet; Take 1 tablet (20 mg total) by mouth daily.   Essential hypertension Well control -Counseled on lifestyle modifications for blood pressure control including reduced dietary sodium, increased exercise, weight reduction and adequate sleep.  Also, educated patient about the risk for cardiovascular events, stroke and heart attack. Also counseled patient about the importance of medication adherence. If you participate in smoking, it is important to stop using tobacco as this will increase the risks associated with uncontrolled blood pressure.   Goal BP:  For patients younger than 60: Goal BP < 130/80. For patients 60 and older: Goal BP < 140/90. For patients with diabetes: Goal BP < 130/80. Your most recent BP: 136/78  Minimize salt intake. Minimize alcohol intake  -     CBC with Differential/Platelet -     CMP14+EGFR -     amLODipine  (NORVASC ) 2.5 MG tablet; Take 1 tablet (2.5 mg total) by mouth daily. -     chlorthalidone  (HYGROTON ) 25 MG tablet; Take 1 tablet (25 mg total) by mouth daily. -     telmisartan  (MICARDIS ) 20 MG tablet; Take 1 tablet (20 mg total) by mouth daily.  This note has been created with Education officer, environmental. Any transcriptional errors are unintentional.   Joann SHAUNNA Bohr, NP 01/03/2024, 11:01 AM       [1] No Known Allergies [2]  No current outpatient medications on file prior to visit.   No current facility-administered medications on file prior to visit.   "

## 2024-01-04 LAB — CBC WITH DIFFERENTIAL/PLATELET
Basophils Absolute: 0 x10E3/uL (ref 0.0–0.2)
Basos: 1 %
EOS (ABSOLUTE): 0 x10E3/uL (ref 0.0–0.4)
Eos: 0 %
Hematocrit: 46.7 % — ABNORMAL HIGH (ref 34.0–46.6)
Hemoglobin: 15.5 g/dL (ref 11.1–15.9)
Immature Grans (Abs): 0 x10E3/uL (ref 0.0–0.1)
Immature Granulocytes: 0 %
Lymphocytes Absolute: 2.5 x10E3/uL (ref 0.7–3.1)
Lymphs: 47 %
MCH: 30 pg (ref 26.6–33.0)
MCHC: 33.2 g/dL (ref 31.5–35.7)
MCV: 90 fL (ref 79–97)
Monocytes Absolute: 0.5 x10E3/uL (ref 0.1–0.9)
Monocytes: 9 %
Neutrophils Absolute: 2.3 x10E3/uL (ref 1.4–7.0)
Neutrophils: 43 %
Platelets: 196 x10E3/uL (ref 150–450)
RBC: 5.17 x10E6/uL (ref 3.77–5.28)
RDW: 13.1 % (ref 11.7–15.4)
WBC: 5.3 x10E3/uL (ref 3.4–10.8)

## 2024-01-04 LAB — CMP14+EGFR
ALT: 10 IU/L (ref 0–32)
AST: 20 IU/L (ref 0–40)
Albumin: 4.5 g/dL (ref 3.8–4.8)
Alkaline Phosphatase: 88 IU/L (ref 49–135)
BUN/Creatinine Ratio: 16 (ref 12–28)
BUN: 14 mg/dL (ref 8–27)
Bilirubin Total: 0.7 mg/dL (ref 0.0–1.2)
CO2: 22 mmol/L (ref 20–29)
Calcium: 10.1 mg/dL (ref 8.7–10.3)
Chloride: 96 mmol/L (ref 96–106)
Creatinine, Ser: 0.87 mg/dL (ref 0.57–1.00)
Globulin, Total: 3.7 g/dL (ref 1.5–4.5)
Glucose: 65 mg/dL — ABNORMAL LOW (ref 70–99)
Potassium: 3.6 mmol/L (ref 3.5–5.2)
Sodium: 138 mmol/L (ref 134–144)
Total Protein: 8.2 g/dL (ref 6.0–8.5)
eGFR: 70 mL/min/1.73

## 2024-01-04 LAB — LIPID PANEL
Chol/HDL Ratio: 4 ratio (ref 0.0–4.4)
Cholesterol, Total: 263 mg/dL — ABNORMAL HIGH (ref 100–199)
HDL: 65 mg/dL
LDL Chol Calc (NIH): 187 mg/dL — ABNORMAL HIGH (ref 0–99)
Triglycerides: 70 mg/dL (ref 0–149)
VLDL Cholesterol Cal: 11 mg/dL (ref 5–40)

## 2024-01-11 ENCOUNTER — Ambulatory Visit (INDEPENDENT_AMBULATORY_CARE_PROVIDER_SITE_OTHER): Payer: Self-pay | Admitting: Primary Care

## 2024-01-14 ENCOUNTER — Other Ambulatory Visit (INDEPENDENT_AMBULATORY_CARE_PROVIDER_SITE_OTHER): Payer: Self-pay | Admitting: Primary Care

## 2024-01-14 DIAGNOSIS — Z760 Encounter for issue of repeat prescription: Secondary | ICD-10-CM

## 2024-01-14 DIAGNOSIS — I10 Essential (primary) hypertension: Secondary | ICD-10-CM

## 2024-01-14 NOTE — Telephone Encounter (Signed)
 Requested by interface surescripts. Duplicate requests. Receipt confirmed by pharmacy 01/03/24 at 11:01 am.  Requested Prescriptions  Refused Prescriptions Disp Refills   telmisartan  (MICARDIS ) 20 MG tablet [Pharmacy Med Name: TELMISARTAN  20MG  TABLETS] 90 tablet 1    Sig: TAKE 1 TABLET(20 MG) BY MOUTH DAILY     Cardiovascular:  Angiotensin Receptor Blockers Passed - 01/14/2024  3:46 PM      Passed - Cr in normal range and within 180 days    Creatinine, Ser  Date Value Ref Range Status  01/03/2024 0.87 0.57 - 1.00 mg/dL Final         Passed - K in normal range and within 180 days    Potassium  Date Value Ref Range Status  01/03/2024 3.6 3.5 - 5.2 mmol/L Final         Passed - Patient is not pregnant      Passed - Last BP in normal range    BP Readings from Last 1 Encounters:  01/03/24 136/78         Passed - Valid encounter within last 6 months    Recent Outpatient Visits           1 week ago Mixed hyperlipidemia   [place] [facility] Celestia Rosaline SQUIBB, NP   6 months ago Mixed hyperlipidemia   [place] [facility] Celestia Rosaline SQUIBB, NP   1 year ago Influenza vaccination declined   [place] [facility] Celestia Rosaline SQUIBB, NP   1 year ago Mixed hyperlipidemia   [place] [facility] Celestia Rosaline SQUIBB, NP   1 year ago Estrogen deficiency   [place] [facility] Celestia Rosaline SQUIBB, NP               amLODipine  (NORVASC ) 2.5 MG tablet [Pharmacy Med Name: AMLODIPINE  BESYLATE 2.5MG  TABLETS] 90 tablet 1    Sig: TAKE 1 TABLET(2.5 MG) BY MOUTH DAILY     Cardiovascular: Calcium  Channel Blockers 2 Passed - 01/14/2024  3:46 PM      Passed - Last BP in normal range    BP Readings from Last 1 Encounters:  01/03/24 136/78         Passed - Last Heart Rate in normal range    Pulse Readings from Last 1 Encounters:  01/03/24 65         Passed - Valid encounter within last 6  months    Recent Outpatient Visits           1 week ago Mixed hyperlipidemia   [place] [facility] Celestia Rosaline SQUIBB, NP   6 months ago Mixed hyperlipidemia   [place] [facility] Celestia Rosaline SQUIBB, NP   1 year ago Influenza vaccination declined   [place] [facility] Celestia Rosaline SQUIBB, NP   1 year ago Mixed hyperlipidemia    [facility] Celestia Rosaline SQUIBB, NP   1 year ago Estrogen deficiency    [facility] Celestia Rosaline SQUIBB, NP               chlorthalidone  (HYGROTON ) 25 MG tablet [Pharmacy Med Name: CHLORTHALIDONE  25MG  TABLETS] 90 tablet 1    Sig: TAKE 1 TABLET(25 MG) BY MOUTH DAILY     Cardiovascular: Diuretics - Thiazide Passed - 01/14/2024  3:46 PM      Passed - Cr in normal range and within 180 days    Creatinine, Ser  Date Value Ref Range  Status  01/03/2024 0.87 0.57 - 1.00 mg/dL Final         Passed - K in normal range and within 180 days    Potassium  Date Value Ref Range Status  01/03/2024 3.6 3.5 - 5.2 mmol/L Final         Passed - Na in normal range and within 180 days    Sodium  Date Value Ref Range Status  01/03/2024 138 134 - 144 mmol/L Final         Passed - Last BP in normal range    BP Readings from Last 1 Encounters:  01/03/24 136/78         Passed - Valid encounter within last 6 months    Recent Outpatient Visits           1 week ago Mixed hyperlipidemia   [place] [facility] Celestia Rosaline SQUIBB, NP   6 months ago Mixed hyperlipidemia   [place] [facility] Celestia Rosaline SQUIBB, NP   1 year ago Influenza vaccination declined   [place] [facility] Celestia Rosaline SQUIBB, NP   1 year ago Mixed hyperlipidemia   [place] [facility] Celestia Rosaline SQUIBB, NP   1 year ago Estrogen deficiency   [place] [facility] Celestia Rosaline SQUIBB,  NP

## 2024-01-14 NOTE — Telephone Encounter (Signed)
 Called pharmacy to verify receipt confirmed by pharmacy 01/03/24 at 11:01 am. Duplicate request. Pharmacy staff reports medication request already received.

## 2024-07-03 ENCOUNTER — Ambulatory Visit (INDEPENDENT_AMBULATORY_CARE_PROVIDER_SITE_OTHER): Payer: Self-pay | Admitting: Primary Care
